# Patient Record
Sex: Female | Born: 1960 | Race: White | Hispanic: No | Marital: Married | Smoking: Never smoker
Health system: Southern US, Community
[De-identification: ages and names within clinical notes are randomized; demographics above are authoritative.]

## PROBLEM LIST (undated history)

## (undated) DIAGNOSIS — Q513 Bicornate uterus: Secondary | ICD-10-CM

## (undated) DIAGNOSIS — D271 Benign neoplasm of left ovary: Secondary | ICD-10-CM

## (undated) DIAGNOSIS — N809 Endometriosis, unspecified: Secondary | ICD-10-CM

## (undated) DIAGNOSIS — R61 Generalized hyperhidrosis: Secondary | ICD-10-CM

## (undated) DIAGNOSIS — H548 Legal blindness, as defined in USA: Secondary | ICD-10-CM

## (undated) HISTORY — PX: DERMOID CYST  EXCISION: SHX1452

## (undated) HISTORY — DX: Legal blindness, as defined in USA: H54.8

## (undated) HISTORY — DX: Endometriosis, unspecified: N80.9

## (undated) HISTORY — DX: Generalized hyperhidrosis: R61

## (undated) HISTORY — DX: Benign neoplasm of left ovary: D27.1

## (undated) HISTORY — DX: Bicornate uterus: Q51.3

## (undated) HISTORY — PX: MOLE REMOVAL: SHX2046

---

## 1999-02-06 ENCOUNTER — Encounter: Payer: Self-pay | Admitting: Obstetrics and Gynecology

## 1999-02-06 ENCOUNTER — Encounter: Admission: RE | Admit: 1999-02-06 | Discharge: 1999-02-06 | Payer: Self-pay | Admitting: Obstetrics and Gynecology

## 2000-08-11 ENCOUNTER — Encounter: Admission: RE | Admit: 2000-08-11 | Discharge: 2000-08-11 | Payer: Self-pay | Admitting: Obstetrics and Gynecology

## 2000-08-11 ENCOUNTER — Encounter: Payer: Self-pay | Admitting: Obstetrics and Gynecology

## 2000-08-17 ENCOUNTER — Encounter: Admission: RE | Admit: 2000-08-17 | Discharge: 2000-08-17 | Payer: Self-pay | Admitting: Obstetrics and Gynecology

## 2000-08-17 ENCOUNTER — Encounter: Payer: Self-pay | Admitting: Obstetrics and Gynecology

## 2001-08-03 ENCOUNTER — Other Ambulatory Visit: Admission: RE | Admit: 2001-08-03 | Discharge: 2001-08-03 | Payer: Self-pay | Admitting: Obstetrics and Gynecology

## 2002-05-10 ENCOUNTER — Encounter: Admission: RE | Admit: 2002-05-10 | Discharge: 2002-05-10 | Payer: Self-pay | Admitting: Obstetrics and Gynecology

## 2002-05-10 ENCOUNTER — Encounter: Payer: Self-pay | Admitting: Obstetrics and Gynecology

## 2002-08-06 ENCOUNTER — Other Ambulatory Visit: Admission: RE | Admit: 2002-08-06 | Discharge: 2002-08-06 | Payer: Self-pay | Admitting: Obstetrics and Gynecology

## 2003-07-15 ENCOUNTER — Other Ambulatory Visit: Admission: RE | Admit: 2003-07-15 | Discharge: 2003-07-15 | Payer: Self-pay | Admitting: Obstetrics and Gynecology

## 2003-07-30 ENCOUNTER — Encounter: Admission: RE | Admit: 2003-07-30 | Discharge: 2003-07-30 | Payer: Self-pay | Admitting: Obstetrics and Gynecology

## 2004-08-11 ENCOUNTER — Other Ambulatory Visit: Admission: RE | Admit: 2004-08-11 | Discharge: 2004-08-11 | Payer: Self-pay | Admitting: Obstetrics and Gynecology

## 2004-12-01 ENCOUNTER — Encounter: Admission: RE | Admit: 2004-12-01 | Discharge: 2004-12-01 | Payer: Self-pay | Admitting: Obstetrics and Gynecology

## 2005-09-21 ENCOUNTER — Other Ambulatory Visit: Admission: RE | Admit: 2005-09-21 | Discharge: 2005-09-21 | Payer: Self-pay | Admitting: Obstetrics & Gynecology

## 2006-02-17 ENCOUNTER — Encounter: Admission: RE | Admit: 2006-02-17 | Discharge: 2006-02-17 | Payer: Self-pay | Admitting: Obstetrics & Gynecology

## 2006-10-06 ENCOUNTER — Other Ambulatory Visit: Admission: RE | Admit: 2006-10-06 | Discharge: 2006-10-06 | Payer: Self-pay | Admitting: Obstetrics and Gynecology

## 2007-04-11 ENCOUNTER — Encounter: Admission: RE | Admit: 2007-04-11 | Discharge: 2007-04-11 | Payer: Self-pay | Admitting: Obstetrics and Gynecology

## 2007-10-24 ENCOUNTER — Other Ambulatory Visit: Admission: RE | Admit: 2007-10-24 | Discharge: 2007-10-24 | Payer: Self-pay | Admitting: Obstetrics & Gynecology

## 2008-08-20 ENCOUNTER — Encounter: Admission: RE | Admit: 2008-08-20 | Discharge: 2008-08-20 | Payer: Self-pay | Admitting: Obstetrics and Gynecology

## 2009-10-13 ENCOUNTER — Encounter: Admission: RE | Admit: 2009-10-13 | Discharge: 2009-10-13 | Payer: Self-pay | Admitting: Obstetrics and Gynecology

## 2010-02-09 ENCOUNTER — Encounter: Payer: Self-pay | Admitting: Obstetrics and Gynecology

## 2011-01-18 ENCOUNTER — Other Ambulatory Visit: Payer: Self-pay | Admitting: Obstetrics and Gynecology

## 2011-01-18 DIAGNOSIS — Z1231 Encounter for screening mammogram for malignant neoplasm of breast: Secondary | ICD-10-CM

## 2011-02-02 ENCOUNTER — Ambulatory Visit: Payer: Self-pay

## 2011-02-24 ENCOUNTER — Ambulatory Visit
Admission: RE | Admit: 2011-02-24 | Discharge: 2011-02-24 | Disposition: A | Discharge status: Home / Self Care | Payer: BC Managed Care – PPO | Source: Ambulatory Visit | Attending: Obstetrics and Gynecology | Admitting: Obstetrics and Gynecology

## 2011-02-24 DIAGNOSIS — Z1231 Encounter for screening mammogram for malignant neoplasm of breast: Secondary | ICD-10-CM

## 2011-02-24 LAB — HM PAP SMEAR: HM Pap smear: NEGATIVE

## 2013-01-01 ENCOUNTER — Other Ambulatory Visit: Payer: Self-pay

## 2013-01-01 DIAGNOSIS — Z1231 Encounter for screening mammogram for malignant neoplasm of breast: Secondary | ICD-10-CM

## 2013-01-30 ENCOUNTER — Ambulatory Visit
Admission: RE | Admit: 2013-01-30 | Discharge: 2013-01-30 | Disposition: A | Discharge status: Home / Self Care | Payer: BC Managed Care – PPO | Source: Ambulatory Visit

## 2013-01-30 DIAGNOSIS — Z1231 Encounter for screening mammogram for malignant neoplasm of breast: Secondary | ICD-10-CM

## 2013-03-30 ENCOUNTER — Ambulatory Visit: Payer: Self-pay | Admitting: Nurse Practitioner

## 2013-04-02 ENCOUNTER — Encounter: Payer: Self-pay | Admitting: Nurse Practitioner

## 2013-04-03 ENCOUNTER — Encounter: Payer: Self-pay | Admitting: Nurse Practitioner

## 2013-04-03 ENCOUNTER — Ambulatory Visit (INDEPENDENT_AMBULATORY_CARE_PROVIDER_SITE_OTHER): Payer: BC Managed Care – PPO | Admitting: Nurse Practitioner

## 2013-04-03 VITALS — BP 120/76 | HR 64 | Resp 16 | Ht 63.0 in | Wt 134.0 lb

## 2013-04-03 DIAGNOSIS — Q513 Bicornate uterus: Secondary | ICD-10-CM | POA: Insufficient documentation

## 2013-04-03 DIAGNOSIS — Z01419 Encounter for gynecological examination (general) (routine) without abnormal findings: Secondary | ICD-10-CM

## 2013-04-03 DIAGNOSIS — Z0000 Encounter for general adult medical examination without abnormal findings: Secondary | ICD-10-CM

## 2013-04-03 DIAGNOSIS — D259 Leiomyoma of uterus, unspecified: Secondary | ICD-10-CM

## 2013-04-03 DIAGNOSIS — D219 Benign neoplasm of connective and other soft tissue, unspecified: Secondary | ICD-10-CM | POA: Insufficient documentation

## 2013-04-03 LAB — POCT URINALYSIS DIPSTICK
Bilirubin, UA: NEGATIVE
Blood, UA: NEGATIVE
GLUCOSE UA: NEGATIVE
Ketones, UA: NEGATIVE
LEUKOCYTES UA: NEGATIVE
Nitrite, UA: NEGATIVE
Protein, UA: NEGATIVE
UROBILINOGEN UA: NEGATIVE
pH, UA: 6.5

## 2013-04-03 LAB — HEMOGLOBIN, FINGERSTICK: HEMOGLOBIN, FINGERSTICK: 13.8 g/dL (ref 12.0–16.0)

## 2013-04-03 MED ORDER — LO LOESTRIN FE 1 MG-10 MCG / 10 MCG PO TABS: 1.0000 | ORAL_TABLET | Freq: Every day | ORAL | Status: DC

## 2013-04-03 NOTE — Patient Instructions (Signed)

## 2013-04-03 NOTE — Progress Notes (Signed)
Patient ID: Sierra Odonnell, female   DOB: 01-Aug-1960, 53 y.o.   MRN: [id number] 53 y.o. [id number] Married Caucasian Fe here for annual exam.  Menses of 4 days and light to spotting.  No further heavy days on the brand name Lo Loestrin.  Last year on brand Lo Loestrin she was having menses at 7 days.  After consultation with Dr. Sabra Odonnell we had decided to do endo biopsy.   Pt was aware of recommendation but did not return for follow-up as her bleeding normalized on the OCPs.   Patient's last menstrual period was 03/22/2013.          Sexually active: yes  The current method of family planning is OCP (estrogen/progesterone).    Exercising: yes  cardio and weights Smoker:  no  Health Maintenance: Pap:  02/24/11, WNL, neg HR HPV MMG:  01/30/13, Bi-Rads 1: negative Colonoscopy:  Never will decide later this year. BMD:  2013 w/PCP, normal TDaP:  2003 Labs:  HB:  13.8 Urine:  negative   reports that she has never smoked. She has never used smokeless tobacco. She reports that she drinks alcohol. She reports that she does not use illicit drugs.  Past Medical History  Diagnosis Date  . Bicornate uterus   . Endometriosis   . Legally blind in right eye, as defined in [place] age 43    20/400, secondary toxoplasmosis  . Hyperhidrosis   . Dermoid cyst of left ovary age 11    Past Surgical History  Procedure Laterality Date  . Cesarean section      x 1  . Dermoid cyst  excision Right age 50    ovarian cyctectomy    Current Outpatient Prescriptions  Medication Sig Dispense Refill  . Calcium Carbonate-Vit D-Min (CALCIUM 1200 PO) Take 1,200 mg by mouth daily.      . Flaxseed, Linseed, (FLAX SEED OIL) 1000 MG CAPS Take by mouth.      . LO LOESTRIN FE 1 MG-10 MCG / 10 MCG tablet Take 1 tablet by mouth daily.  3 Package  3  . MAGNESIUM PO Take by mouth.      . Multiple Vitamin (MULTIVITAMIN) tablet Take 1 tablet by mouth daily.      [name] Kitchen b complex vitamins tablet Take 1 tablet by mouth daily.       No  current facility-administered medications for this visit.    Family History  Problem Relation Age of Onset  . Hyperlipidemia Mother   . Thyroid disease Mother   . Hypertension Father     ROS:  Pertinent items are noted in HPI.  Otherwise, a comprehensive ROS was negative.  Exam:   BP 120/76  Pulse 64  Resp 16  Ht 5\' 3"  (1.6 m)  Wt 134 lb (60.782 kg)  BMI 23.74 kg/m2  LMP 03/22/2013 Height: 5\' 3"  (160 cm)  Ht Readings from Last 3 Encounters:  04/03/13 5\' 3"  (1.6 m)    General appearance: alert, cooperative and appears stated age Head: Normocephalic, without obvious abnormality, atraumatic Neck: no adenopathy, supple, symmetrical, trachea midline and thyroid normal to inspection and palpation Lungs: clear to auscultation bilaterally Breasts: normal appearance, no masses or tenderness Heart: regular rate and rhythm Abdomen: soft, non-tender; no masses,  no organomegaly Extremities: extremities normal, atraumatic, no cyanosis or edema Skin: Skin color, texture, turgor normal. No rashes or lesions Lymph nodes: Cervical, supraclavicular, and axillary nodes normal. No abnormal inguinal nodes palpated Neurologic: Grossly normal   Pelvic: External genitalia:  no  lesions              Urethra:  normal appearing urethra with no masses, tenderness or lesions              Bartholin's and Skene's: normal                 Vagina: normal appearing vagina with normal color and discharge, no lesions              Cervix: anteverted              Pap taken: no Bimanual Exam:  Uterus:  normal size, contour, position, consistency, mobility, non-tender              Adnexa: no mass, fullness, tenderness               Rectovaginal: Confirms               Anus:  normal sphincter tone, no lesions  A:  Well Woman with normal exam  Perimenopausal on OCP  History of menorrhagia - now better  P:   Pap smear as per guidelines not done   Mammogram due 01/2014  Refilled OCP - Lo Loestrin brand name  and savings card given  She is aware that I will consult with Dr. Sabra Odonnell and may decide to DC OCP and see how she does. - she is concerned about hormonal imbalance and menorrhagia again.  Counseled on breast self exam, mammography screening, adequate intake of calcium and vitamin D, diet and exercise return annually or prn  An After Visit Summary was printed and given to the patient.

## 2013-04-09 NOTE — Progress Notes (Signed)
Reviewed personally.  M. Suzanne Lenzen, MD.  

## 2013-05-10 ENCOUNTER — Telehealth: Payer: Self-pay | Admitting: Nurse Practitioner

## 2013-05-10 DIAGNOSIS — N938 Other specified abnormal uterine and vaginal bleeding: Secondary | ICD-10-CM

## 2013-05-10 NOTE — Telephone Encounter (Signed)
Left msg for pt. To CB ----Tried to call patient and discuss with her Dr. Ammie Ferrier recommendation for PUS to check endo lining and fibroids.

## 2013-05-15 NOTE — Telephone Encounter (Signed)
I was able to contact patient and let her know about the recommendation of PUS by Dr. Sabra Heck.  She is agreeable but has concerns about insurance coverage.  She will need to make final decision once she finds that out.  I will place order and go from there.

## 2013-05-16 ENCOUNTER — Telehealth: Payer: Self-pay | Admitting: Obstetrics and Gynecology

## 2013-05-16 NOTE — Telephone Encounter (Signed)
Left message for patient to call back. Need to go over benefits and schedule PUS °

## 2013-05-21 NOTE — Telephone Encounter (Signed)
patient wants to hold off on this procedure for financial reasons.

## 2013-11-19 ENCOUNTER — Encounter: Payer: Self-pay | Admitting: Nurse Practitioner

## 2014-01-31 ENCOUNTER — Telehealth: Payer: Self-pay | Admitting: Nurse Practitioner

## 2014-01-31 NOTE — Telephone Encounter (Signed)
Left message to reschedule aex appointment. °

## 2014-02-13 ENCOUNTER — Telehealth: Payer: Self-pay | Admitting: Nurse Practitioner

## 2014-02-13 NOTE — Telephone Encounter (Signed)
Spoke with patient. Patient states that she wants to discuss coming off of her Lo Loestrin at this time. Patient would like to be seen with Milford Cage, FNP before stopping OCP to go over everything and discuss it with her. Last aex was 04/03/13. Moved appointment up to 3/22 at 10:15am with Milford Cage, FNP. Patient is agreeable to date and time.  Routing to provider for final review. Patient agreeable to disposition. Will close encounter

## 2014-02-13 NOTE — Telephone Encounter (Signed)
Patient is askiing if she needs to continue with her lo lo estrin until her next appointment 04/20/14. Patient thinks she may want to stop taking this prescription and would like to know what to expect. Last seen 04/03/13 with Edman Circle.

## 2014-02-25 ENCOUNTER — Other Ambulatory Visit: Payer: Self-pay | Admitting: Nurse Practitioner

## 2014-02-26 NOTE — Telephone Encounter (Signed)
Medication refill request: Lo Loestrin FE 1/10 Last AEX:  04/03/13 with Ms. Patty Next AEX: 04/09/14 with Ms. Patty Last MMG (if hormonal medication request): 01/31/13 Breast Density category D: Bi-Rads 1: Negative Refill authorized: #28/1 rfs, please advise.

## 2014-03-19 ENCOUNTER — Other Ambulatory Visit: Payer: Self-pay

## 2014-03-19 DIAGNOSIS — Z1231 Encounter for screening mammogram for malignant neoplasm of breast: Secondary | ICD-10-CM

## 2014-03-23 ENCOUNTER — Other Ambulatory Visit: Payer: Self-pay | Admitting: Nurse Practitioner

## 2014-03-25 ENCOUNTER — Ambulatory Visit
Admission: RE | Admit: 2014-03-25 | Discharge: 2014-03-25 | Disposition: A | Discharge status: Home / Self Care | Payer: BLUE CROSS/BLUE SHIELD | Source: Ambulatory Visit

## 2014-03-25 ENCOUNTER — Encounter (INDEPENDENT_AMBULATORY_CARE_PROVIDER_SITE_OTHER): Payer: Self-pay

## 2014-03-25 DIAGNOSIS — Z1231 Encounter for screening mammogram for malignant neoplasm of breast: Secondary | ICD-10-CM

## 2014-04-05 ENCOUNTER — Ambulatory Visit: Payer: Self-pay | Admitting: Nurse Practitioner

## 2014-04-09 ENCOUNTER — Ambulatory Visit (INDEPENDENT_AMBULATORY_CARE_PROVIDER_SITE_OTHER): Payer: BLUE CROSS/BLUE SHIELD | Admitting: Nurse Practitioner

## 2014-04-09 ENCOUNTER — Encounter: Payer: Self-pay | Admitting: Nurse Practitioner

## 2014-04-09 VITALS — BP 136/82 | HR 68 | Ht 63.0 in | Wt 136.0 lb

## 2014-04-09 DIAGNOSIS — Z0000 Encounter for general adult medical examination without abnormal findings: Secondary | ICD-10-CM

## 2014-04-09 DIAGNOSIS — Z01419 Encounter for gynecological examination (general) (routine) without abnormal findings: Secondary | ICD-10-CM

## 2014-04-09 LAB — HEMOGLOBIN, FINGERSTICK: HEMOGLOBIN, FINGERSTICK: 13.4 g/dL (ref 12.0–16.0)

## 2014-04-09 NOTE — Patient Instructions (Addendum)

## 2014-04-09 NOTE — Progress Notes (Signed)
Patient ID: Sierra Odonnell, female   DOB: 1961/01/07, 54 y.o.   MRN: [id number] 54 y.o. [id number] Married  Caucasian Fe here for annual exam.   For past 6-7 months menstrual irregularity.  She will gets a cycle for 2 weeks.  Bleeding is on and off during that time.  Some moderate days that is 1-3 days, changing tampon every 4-7 hours.  No vaso symptoms.  She has a history of bicornate uterus with last PUS 07/2011 showing UTE fibroids.  She is on Lo Loestrin and is afraid to come off OCP as there may be more AUB.   Patient's last menstrual period was 03/24/2014.          Sexually active: Yes.    The current method of family planning is OCP (estrogen/progesterone).    Exercising:  "somewhat", weights and stretching Smoker:  no  Health Maintenance: Pap:  02/24/11, negative with neg HR HPV MMG:  03/25/14, Bi-Rads 1:  Negative Colonoscopy:  Will schedule with PCP BMD:   2014, PCP, normal per patient TD: 2003 ? Repeat in 2013 Labs: HB:  13.4  Urine:  Negative    reports that she has never smoked. She has never used smokeless tobacco. She reports that she drinks alcohol. She reports that she does not use illicit drugs.  Past Medical History  Diagnosis Date  . Bicornate uterus   . Endometriosis   . Legally blind in right eye, as defined in [place] age 38    20/400, secondary toxoplasmosis  . Hyperhidrosis   . Dermoid cyst of left ovary age 73    Past Surgical History  Procedure Laterality Date  . Cesarean section      x 1  . Dermoid cyst  excision Right age 67    ovarian cyctectomy    Current Outpatient Prescriptions  Medication Sig Dispense Refill  . b complex vitamins tablet Take 1 tablet by mouth daily.    . Calcium Carbonate-Vit D-Min (CALCIUM 1200 PO) Take 1,200 mg by mouth daily.    . Flaxseed, Linseed, (FLAX SEED OIL) 1000 MG CAPS Take by mouth.    . LO LOESTRIN FE 1 MG-10 MCG / 10 MCG tablet TAKE 1 TABLET BY MOUTH DAILY. 28 tablet 1  . MAGNESIUM PO Take by mouth.    . Multiple  Vitamin (MULTIVITAMIN) tablet Take 1 tablet by mouth daily.    . Ranitidine HCl (ACID REDUCER PO) Take 1 tablet by mouth daily.     No current facility-administered medications for this visit.    Family History  Problem Relation Age of Onset  . Hyperlipidemia Mother   . Thyroid disease Mother   . Hypertension Father     ROS:  Pertinent items are noted in HPI.  Otherwise, a comprehensive ROS was negative.  Exam:   BP 136/82 mmHg  Pulse 68  Ht 5\' 3"  (1.6 m)  Wt 136 lb (61.689 kg)  BMI 24.10 kg/m2  LMP 03/24/2014 Height: 5\' 3"  (160 cm) Ht Readings from Last 3 Encounters:  04/09/14 5\' 3"  (1.6 m)  04/03/13 5\' 3"  (1.6 m)    General appearance: alert, cooperative and appears stated age Head: Normocephalic, without obvious abnormality, atraumatic Neck: no adenopathy, supple, symmetrical, trachea midline and thyroid normal to inspection and palpation Lungs: clear to auscultation bilaterally Breasts: normal appearance, no masses or tenderness Heart: regular rate and rhythm Abdomen: soft, non-tender; no masses,  no organomegaly Extremities: extremities normal, atraumatic, no cyanosis or edema Skin: Skin color, texture, turgor normal. No  rashes or lesions Lymph nodes: Cervical, supraclavicular, and axillary nodes normal. No abnormal inguinal nodes palpated Neurologic: Grossly normal   Pelvic: External genitalia:  no lesions              Urethra:  normal appearing urethra with no masses, tenderness or lesions              Bartholin's and Skene's: normal                 Vagina: normal appearing vagina with normal color and discharge, no lesions              Cervix: anteverted              Pap taken: Yes.   Bimanual Exam:  Uterus:  enlarged, 6-8  weeks size              Adnexa: no mass, fullness, tenderness               Rectovaginal: Confirms               Anus:  normal sphincter tone, no lesions  Chaperone present:  yes  A:  Well Woman with normal exam  Perimenopausal on  OCP History of menorrhagia - now AUB on OCP   P:   Reviewed health and wellness pertinent to exam  Pap smear not taken today  Mammogram is due 3/17  She is on week # 2 of current OCP pack - did not refill OCP at this time  Will need to review with Dr. Sabra Heck if another PUS should be done with AUB or come off OCP now.  Counseled on breast self exam, mammography screening, use and side effects of OCP's, adequate intake of calcium and vitamin D, diet and exercise return annually or prn  An After Visit Summary was printed and given to the patient.

## 2014-04-12 LAB — IPS PAP TEST WITH HPV

## 2014-04-17 NOTE — Progress Notes (Signed)
Needs PUS and endometrial biopsy.  Will decide after exam/results regarding OCPs.  Reviewed personally.  Felipa Emory, MD.

## 2014-04-19 ENCOUNTER — Other Ambulatory Visit: Payer: Self-pay | Admitting: Nurse Practitioner

## 2014-04-19 DIAGNOSIS — N939 Abnormal uterine and vaginal bleeding, unspecified: Secondary | ICD-10-CM

## 2014-04-22 ENCOUNTER — Telehealth: Payer: Self-pay | Admitting: Nurse Practitioner

## 2014-04-22 ENCOUNTER — Ambulatory Visit: Payer: Self-pay | Admitting: Nurse Practitioner

## 2014-04-22 DIAGNOSIS — N939 Abnormal uterine and vaginal bleeding, unspecified: Secondary | ICD-10-CM

## 2014-04-22 NOTE — Telephone Encounter (Signed)
Left message for patient to call back. Need to go over benefits and schedule PUS °

## 2014-04-23 NOTE — Telephone Encounter (Signed)
-----   Message from Antonietta Barcelona, [place] sent at 04/19/2014  7:16 PM EDT ----- See note about PUS and endo biopsy.  Will you let pt. Know that this is the plan per Dr. Sabra Heck - I will place orders. ----- Message -----    From: Megan Salon, MD    Sent: 04/17/2014   2:12 PM      To: Antonietta Barcelona, FNP    ----- Message -----    From: Antonietta Barcelona, FNP    Sent: 04/09/2014   5:53 PM      To: Megan Salon, MD

## 2014-04-24 NOTE — Telephone Encounter (Addendum)
Spoke with patient to advise of recommendations from Dr. Sabra Heck and Milford Cage, [place].  Evaluation of AUB with  endometrial biopsy and Pelvic ultrasound.   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   Advised patient would let provider know regarding her concerns and she will be contacted back to discuss insurance coverage. She is agreeable.

## 2014-04-24 NOTE — Telephone Encounter (Signed)
Call to patient. Advised of benefit quote received for PUS/EMB. Patient declines due to costs. Patient would like to know if there is any alternative treatment available.

## 2014-05-03 NOTE — Telephone Encounter (Signed)
Routing to Dr Overbey for review.  

## 2014-05-05 NOTE — Telephone Encounter (Signed)
I am aware pt concerned due to cost and possibility that no "conclusive result" will be found.  Lots of conclusive results can be found with ultrasound +/- endometrial biopsy like adenomyosis, fibroids, polyps, malignancies.  Do not feel pt should continue on OCPs without some evaluation as bleeding is irregular on OCPs.    Could start with PUS and only if there are significant findings, would biopsy be done.  However, cannot guarantee that it won't be done.  Not sure she is going to proceed with anything at this time.

## 2014-05-08 NOTE — Telephone Encounter (Signed)
Message left to return call to Mckaylah Bettendorf at [phone].    

## 2014-05-14 NOTE — Telephone Encounter (Signed)
Spoke with patient. She is given message from Dr. Sabra Heck and she is agreeable to scheduling ultrasound at this time.  She is scheduled for 05/21/14 with Dr. Sabra Heck.   Routing to provider for final review. Patient agreeable to disposition. Will close encounter

## 2014-05-14 NOTE — Telephone Encounter (Signed)
Message left to return call to Tery Hoeger at [phone].    

## 2014-05-16 ENCOUNTER — Telehealth: Payer: Self-pay | Admitting: Obstetrics & Gynecology

## 2014-05-16 NOTE — Telephone Encounter (Signed)
Left message for patient to call back. Need to go over out of pocket amount for PUS

## 2014-05-18 ENCOUNTER — Other Ambulatory Visit: Payer: Self-pay | Admitting: Nurse Practitioner

## 2014-05-20 NOTE — Telephone Encounter (Signed)
Medication refill request: Lo Loestrin  Last AEX:  04/09/14 PG Next AEX: 04/15/15 PG Last MMG (if hormonal medication request): 03/25/14 [id number]:Neg Refill authorized: 02/26/14 #28 tabs/ 1 Refills. Today please advise.

## 2014-05-21 ENCOUNTER — Other Ambulatory Visit: Payer: Self-pay | Admitting: Obstetrics & Gynecology

## 2014-05-21 ENCOUNTER — Ambulatory Visit: Payer: BLUE CROSS/BLUE SHIELD

## 2014-05-21 ENCOUNTER — Ambulatory Visit (INDEPENDENT_AMBULATORY_CARE_PROVIDER_SITE_OTHER): Payer: BLUE CROSS/BLUE SHIELD | Admitting: Obstetrics & Gynecology

## 2014-05-21 VITALS — BP 128/82 | Wt 133.4 lb

## 2014-05-21 DIAGNOSIS — N939 Abnormal uterine and vaginal bleeding, unspecified: Secondary | ICD-10-CM | POA: Diagnosis not present

## 2014-05-21 DIAGNOSIS — Q513 Bicornate uterus: Secondary | ICD-10-CM

## 2014-05-21 DIAGNOSIS — D251 Intramural leiomyoma of uterus: Secondary | ICD-10-CM | POA: Diagnosis not present

## 2014-05-21 MED ORDER — NORETHINDRONE 0.35 MG PO TABS
1.0000 | ORAL_TABLET | Freq: Every day | ORAL | Status: DC
Start: 2014-05-21 — End: 2015-04-10

## 2014-05-21 NOTE — Telephone Encounter (Signed)
See note from Dr. Sabra Heck as she feels she should not continue on OCP secondary to AUB.

## 2014-05-21 NOTE — Progress Notes (Signed)
54 y.o. G34P2A1 Married W female here for a pelvic ultrasound due to known hx of fibroids and increased DUB.  Pt has hx of bicornuate uterus as well.  Pt is on Loloestrin for contraception.  OVer last six months, bleeding has become more irregular.  For example, last month she spotted for almost three weeks.  None of the bleeding was heavy or with clots.  She did have some mild cramping at time.  She does take her pills regularly but reports there is just regularity to the bleeding.  She admits having a high deductible health insurance plan currently and is very anxious about any additional health care costs.  Wants to do what is best but also very cautious.  Sexually active:  yes  Contraception: oral contraceptives (estrogen/progesterone)  FINDINGS: UTERUS: 10 x 9 x 5 cm, bicornuate.  Four fibroids present.  Three noted with last PUS done 08/11/11 in our office.  Largest is 5.5cm (was 3.7), then 4.6 (was 3.8cm), and 1.6cm 9was 1.3cm) and another 1.7cm fibroid.   EMS: 2.1 and 2.33mm on each side ADNEXA:   Left ovary 3.1 x 1.2 x 0.8cm   Right ovary 3.0 x 1.6 x 0.7cm CUL DE SAC: no free fluid  Reviewed images with patient and compared prior ultrasound images for pt to see changes.  Due to thine endometrium, feel biopsy will not give much useful information.  Would like to know pt's hormonal status, considering age.  Also, d/w pt transitioning from estrogen containing OCPs to progestin only OCP for risk reduction (DVT, PE, stroke, MI).  Pt actually very comfortable with this idea.  Will then plan [facility] after pt has been off estrogen containing OCPs for three to four weeks.  Hopefully, this wil help with cycles right now.  Pt is not interested in any surgery at this time and is hopeful she will get to menopause where the fibroids will shrink and symptoms will resolve.   Assessment:  Uterine fibroids, bicornuate uterus, DUB  Plan:  Will change to micronor from [employer]  Pt can transition now and not finish  current OCP pack.  Aware needs to be off Loloestrin for three weeks or so before having [place].  Order placed.  Pt will call back to scheduled.  Plan recheck three to four months after switching to micronor.    ~20 minutes spent with patient >50% of time was in face to face discussion of above.

## 2014-05-22 ENCOUNTER — Encounter: Payer: Self-pay | Admitting: Obstetrics & Gynecology

## 2014-05-22 DIAGNOSIS — N939 Abnormal uterine and vaginal bleeding, unspecified: Secondary | ICD-10-CM | POA: Insufficient documentation

## 2014-05-22 DIAGNOSIS — Q513 Bicornate uterus: Secondary | ICD-10-CM | POA: Insufficient documentation

## 2014-05-22 DIAGNOSIS — D251 Intramural leiomyoma of uterus: Secondary | ICD-10-CM | POA: Insufficient documentation

## 2014-06-18 ENCOUNTER — Other Ambulatory Visit (INDEPENDENT_AMBULATORY_CARE_PROVIDER_SITE_OTHER): Payer: BLUE CROSS/BLUE SHIELD

## 2014-06-18 DIAGNOSIS — N939 Abnormal uterine and vaginal bleeding, unspecified: Secondary | ICD-10-CM

## 2014-06-19 LAB — FOLLICLE STIMULATING HORMONE: FSH: 99.4 m[IU]/mL

## 2014-06-24 ENCOUNTER — Telehealth: Payer: Self-pay | Admitting: Nurse Practitioner

## 2014-06-24 NOTE — Telephone Encounter (Signed)
Patient states she is lookingfor result no my chart log in. Patient ok for call back

## 2014-06-24 NOTE — Telephone Encounter (Addendum)
Left message to call Warrick at [phone].    Notes Recorded by Megan Salon, MD on 06/21/2014 at 5:27 PM Please inform [place] is 99. She is clearly in menopause. Stop all OCPs and call with any future bleeding. If she has any bleeding immediately after stopping pills, this doesn't count. Since stopping all hormones, I would like to repeat PUS in 4-6 months to make sure fibroids are not growing. They had increased in size with last ultrasound in comparison to prior ultrasound. Thanks.

## 2014-06-25 NOTE — Telephone Encounter (Signed)
Spoke with patient. Advised of message as seen below from Sierra Odonnell. Patient is agreeable and verbalizes understanding. Patient declines to schedule PUS at this time. Would like to check her calendar and return call to schedule. Patient states she has been experiencing vaginal dryness lately. Advised this can come from being in menopausal range. Advised may use Olive Oil or Coconut Oil as needed for increased comfort and moisture. Advised may also discuss prescription vaginal cream with Dr.Earnest if she does not have relief. Patient is agreeable.  Routing to provider for final review. Patient agreeable to disposition. Will close encounter.

## 2015-02-19 DIAGNOSIS — L409 Psoriasis, unspecified: Secondary | ICD-10-CM | POA: Insufficient documentation

## 2015-04-10 ENCOUNTER — Ambulatory Visit (INDEPENDENT_AMBULATORY_CARE_PROVIDER_SITE_OTHER): Payer: BLUE CROSS/BLUE SHIELD | Admitting: Obstetrics and Gynecology

## 2015-04-10 ENCOUNTER — Encounter: Payer: Self-pay | Admitting: Obstetrics and Gynecology

## 2015-04-10 VITALS — BP 110/70 | HR 80 | Resp 14 | Ht 62.5 in | Wt 131.0 lb

## 2015-04-10 DIAGNOSIS — N939 Abnormal uterine and vaginal bleeding, unspecified: Secondary | ICD-10-CM | POA: Diagnosis not present

## 2015-04-10 DIAGNOSIS — Z124 Encounter for screening for malignant neoplasm of cervix: Secondary | ICD-10-CM

## 2015-04-10 DIAGNOSIS — Z01419 Encounter for gynecological examination (general) (routine) without abnormal findings: Secondary | ICD-10-CM | POA: Diagnosis not present

## 2015-04-10 NOTE — Patient Instructions (Signed)

## 2015-04-10 NOTE — Progress Notes (Signed)
Patient ID: Sierra Odonnell, female   DOB: 1960/02/19, 55 y.o.   MRN: [id number] 55 y.o. [id number] MarriedCaucasianF here for annual exam.  The patient has a h/o a bicornuate uterus, fibroids and AUB. Last year she was taken off of Loloestrin and was put on micronor. She went off the micronor in 6/16 after having an elevated FSH. Since going off POP she was having some occasional spotting x 4-5 months. No bleeding from 11/16 until last week. That was light spotting for 3 days. Slight discomfort prior to the spotting occuring. Sexually active, no pain. Some vaginal dryness, currently better. Occasional vasomotor symptoms.  Period Duration (Days): 3 days spotting  Period Pattern: (!) Irregular Menstrual Flow: Light Menstrual Control: Panty liner Dysmenorrhea: None  Patient's last menstrual period was 03/31/2015.          Sexually active: Yes.    The current method of family planning is none.    Exercising: No.  The patient does not participate in regular exercise at present. Smoker:  no  Health Maintenance: Pap:  3--22-16 WNL NEG HR HPV History of abnormal Pap:  no MMG:  03-26-14 WNL  Colonoscopy:  11-29-14 polyps repeat in 5 yrs BMD:   2014 WNL per patient TDaP:  09/2014 Gardasil: N/A   reports that she has never smoked. She has never used smokeless tobacco. She reports that she drinks alcohol. She reports that she does not use illicit drugs. She works as a Geophysicist/field seismologist. Daughters are 96 and 45. She has a 72 month old grandson, living with her right now.   Past Medical History  Diagnosis Date  . Bicornate uterus   . Endometriosis   . Legally blind in right eye, as defined in [place] age 85    20/400, secondary toxoplasmosis  . Hyperhidrosis   . Dermoid cyst of left ovary age 51    Past Surgical History  Procedure Laterality Date  . Cesarean section      x 1  . Dermoid cyst  excision Right age 3    ovarian cyctectomy  1 Vaginal delivery at 61 weeks, 1 C/S at 15 weeks for  breech  Current Outpatient Prescriptions  Medication Sig Dispense Refill  . b complex vitamins tablet Take 1 tablet by mouth daily.    . Calcium Carbonate-Vit D-Min (CALCIUM 1200 PO) Take 1,200 mg by mouth daily.    . Flaxseed, Linseed, (FLAX SEED OIL) 1000 MG CAPS Take by mouth.    [name] Kitchen MAGNESIUM PO Take by mouth.    . Multiple Vitamin (MULTIVITAMIN) tablet Take 1 tablet by mouth daily.    . Ranitidine HCl (ACID REDUCER PO) Take 1 tablet by mouth daily.     No current facility-administered medications for this visit.    Family History  Problem Relation Age of Onset  . Hyperlipidemia Mother   . Thyroid disease Mother   . Hypertension Father     Review of Systems  Constitutional: Negative.   HENT: Negative.   Eyes: Negative.   Respiratory: Negative.   Cardiovascular: Negative.   Gastrointestinal: Negative.   Endocrine: Negative.   Genitourinary: Positive for menstrual problem.       Irregular vaginal bleeding   Musculoskeletal: Negative.   Skin: Negative.   Allergic/Immunologic: Negative.   Neurological: Negative.   Psychiatric/Behavioral: Negative.     Exam:   BP 110/70 mmHg  Pulse 80  Resp 14  Ht 5' 2.5" (1.588 m)  Wt 131 lb (59.421 kg)  BMI 23.56 kg/m2  LMP  03/31/2015  Weight change: @WEIGHTCHANGE @ Height:   Height: 5' 2.5" (158.8 cm)  Ht Readings from Last 3 Encounters:  04/10/15 5' 2.5" (1.588 m)  04/09/14 5\' 3"  (1.6 m)  04/03/13 5\' 3"  (1.6 m)    General appearance: alert, cooperative and appears stated age Head: Normocephalic, without obvious abnormality, atraumatic Neck: no adenopathy, supple, symmetrical, trachea midline and thyroid normal to inspection and palpation Lungs: clear to auscultation bilaterally Breasts: normal appearance, no masses or tenderness Heart: regular rate and rhythm Abdomen: soft, non-tender; bowel sounds normal; no masses,  no organomegaly Extremities: extremities normal, atraumatic, no cyanosis or edema Skin: Skin color,  texture, turgor normal. No rashes or lesions Lymph nodes: Cervical, supraclavicular, and axillary nodes normal. No abnormal inguinal nodes palpated Neurologic: Grossly normal   Pelvic: External genitalia:  no lesions              Urethra:  normal appearing urethra with no masses, tenderness or lesions              Bartholins and Skenes: normal                 Vagina: normal appearing vagina with mild atrophy, normal color and discharge, no lesions. Small amount of blood in the vagina.              Cervix: no lesions               Bimanual Exam:  Uterus:  normal size, contour, position, consistency, mobility, non-tender              Adnexa: no mass, fullness, tenderness               Rectovaginal: Confirms               Anus:  normal sphincter tone, no lesions  Chaperone was present for exam.  A:  Well Woman with normal exam  Abnormal uterine bleeding, suspect PMP  P:   Labs and immunizations with her primary MD  Pap with hpv  FSH, AMH  Return for an ultrasound and possible endometrial biopsy, possible sonohysterogram  Due for a mammogram  Colonoscopy UTD  Discussed breast self exam  Discussed calcium and vit D intake

## 2015-04-11 ENCOUNTER — Ambulatory Visit: Payer: BLUE CROSS/BLUE SHIELD | Admitting: Nurse Practitioner

## 2015-04-11 ENCOUNTER — Telehealth: Payer: Self-pay | Admitting: Obstetrics and Gynecology

## 2015-04-11 DIAGNOSIS — K219 Gastro-esophageal reflux disease without esophagitis: Secondary | ICD-10-CM | POA: Insufficient documentation

## 2015-04-11 LAB — IPS PAP TEST WITH REFLEX TO HPV

## 2015-04-11 LAB — FOLLICLE STIMULATING HORMONE: FSH: 108.5 m[IU]/mL

## 2015-04-11 NOTE — Telephone Encounter (Signed)
Spoke with patient. Advised of message as seen below from [place]. She is agreeable and verbalizes understanding. Aware AMH and pap smear are still pending and she will be notified when these results return.  Cc: Dr.Jertson  Routing to covering provider for final review. Patient agreeable to disposition. Will close encounter.

## 2015-04-11 NOTE — Telephone Encounter (Signed)
Spoke with Sierra Odonnell regarding benefit for ultrasound. Patient understood and agreeable. Patient ready to schedule. Patient scheduled 04/22/15 with Dr Talbert Nan. Sierra Odonnell aware of arrival date and time. Sierra Odonnell aware of 72 hours cancellation policy with [id number] fee. No further questions. Ok to close

## 2015-04-11 NOTE — Telephone Encounter (Signed)
Left message to call Kaitlyn at [phone]. 

## 2015-04-11 NOTE — Telephone Encounter (Signed)
When speaking with patient regarding benefits, she asked if lab results were available for recent tests performed in our office. Routing to Clinical Triage for review

## 2015-04-11 NOTE — Telephone Encounter (Signed)
FSH is 108.5, consistent with menopause.   Cc- Dr. Talbert Nan.

## 2015-04-11 NOTE — Telephone Encounter (Signed)
Patient was seen on 04/10/2015 with Dr.Jertson. Had AMH, [place], and pap smear performed. Routing to Dr.Silva for review of [place] results. Pap and AMH are still pending.

## 2015-04-14 LAB — ANTI MULLERIAN HORMONE: AMH AssessR: <0.03 ng/mL

## 2015-04-15 ENCOUNTER — Ambulatory Visit: Payer: BLUE CROSS/BLUE SHIELD | Admitting: Nurse Practitioner

## 2015-04-22 ENCOUNTER — Ambulatory Visit (INDEPENDENT_AMBULATORY_CARE_PROVIDER_SITE_OTHER): Payer: BLUE CROSS/BLUE SHIELD

## 2015-04-22 ENCOUNTER — Ambulatory Visit (INDEPENDENT_AMBULATORY_CARE_PROVIDER_SITE_OTHER): Payer: BLUE CROSS/BLUE SHIELD | Admitting: Obstetrics and Gynecology

## 2015-04-22 ENCOUNTER — Encounter: Payer: Self-pay | Admitting: Obstetrics and Gynecology

## 2015-04-22 VITALS — BP 118/80 | HR 80 | Resp 16 | Wt 132.0 lb

## 2015-04-22 DIAGNOSIS — N939 Abnormal uterine and vaginal bleeding, unspecified: Secondary | ICD-10-CM | POA: Diagnosis not present

## 2015-04-22 DIAGNOSIS — D259 Leiomyoma of uterus, unspecified: Secondary | ICD-10-CM | POA: Diagnosis not present

## 2015-04-22 DIAGNOSIS — Q513 Bicornate uterus: Secondary | ICD-10-CM

## 2015-04-22 DIAGNOSIS — N950 Postmenopausal bleeding: Secondary | ICD-10-CM

## 2015-04-22 NOTE — Progress Notes (Signed)
Patient ID: Sierra Odonnell, female   DOB: [birth year], 55 y.o.   MRN: [id number] GYNECOLOGY  VISIT   HPI: 55 y.o.   Married  Caucasian  female   [id number] with Patient's last menstrual period was 03/31/2015.   here for ultra sound- transvaginal/pelvic. The patient was taken off of micronor last June after having an elevated FSH. She has had a few episodes of spotting, The last 2 episodes were in November, 2016 and March in 2017. No bleeding since then. She had a recent PMP [place] and AMH.   GYNECOLOGIC HISTORY: Patient's last menstrual period was 03/31/2015. Contraception:none Menopausal hormone therapy: none         OB History    Gravida Para Term Preterm AB TAB SAB Ectopic Multiple Living   3 2 0 2 1 1    2          Patient Active Problem List   Diagnosis Date Noted  . Abnormal uterine bleeding (AUB) 05/22/2014  . Bicornuate uterus 05/22/2014  . Intramural leiomyoma of uterus 05/22/2014  . Bicornate uterus 04/03/2013  . Fibroids 04/03/2013    Past Medical History  Diagnosis Date  . Bicornate uterus   . Endometriosis   . 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 Hyperhidrosis   . Dermoid cyst of left ovary age 55    Past Surgical History  Procedure Laterality Date  . Cesarean section      x 1  . Dermoid cyst  excision Right age 55    ovarian cyctectomy    Current Outpatient Prescriptions  Medication Sig Dispense Refill  . b complex vitamins tablet Take 1 tablet by mouth daily.    . Calcium Carbonate-Vit D-Min (CALCIUM 1200 PO) Take 1,200 mg by mouth daily.    . Flaxseed, Linseed, (FLAX SEED OIL) 1000 MG CAPS Take by mouth.    [name] Kitchen MAGNESIUM PO Take by mouth.    . Multiple Vitamin (MULTIVITAMIN) tablet Take 1 tablet by mouth daily.    . Ranitidine HCl (ACID REDUCER PO) Take 1 tablet by mouth daily.     No current facility-administered medications for this visit.     ALLERGIES: Review of patient's allergies indicates no  known allergies.  Family History  Problem Relation Age of Onset  . Hyperlipidemia Mother   . Thyroid disease Mother   . Hypertension Father     Social History   Social History  . Marital Status: Married    Spouse Name: N/A  . Number of Children: 2  . Years of Education: N/A   Occupational History  . Not on file.   Social History Main Topics  . Smoking status: Never Smoker   . Smokeless tobacco: Never Used  . Alcohol Use: 0.0 oz/week    0 Standard drinks or equivalent per week  . Drug Use: No  . Sexual Activity:    Partners: Male    Birth Control/ Protection: None, Pill   Other Topics Concern  . Not on file   Social History Narrative    Review of Systems  Constitutional: Negative.   HENT: Negative.   Eyes: Negative.   Respiratory: Negative.   Cardiovascular: Negative.   Gastrointestinal: Negative.   Genitourinary: Negative.   Musculoskeletal: Negative.   Skin: Negative.   Neurological: Negative.   Endo/Heme/Allergies: Negative.   Psychiatric/Behavioral: Negative.     PHYSICAL EXAMINATION:    BP 118/80 mmHg  Pulse 80  Resp 16  Wt 132 lb (59.875 kg)  LMP 03/31/2015    General appearance: alert, cooperative and appears stated age  Ultrasound pictures were reviewed with the patient. Her fibroids are much smaller. She has a bicornuate uterus with a uniformly thin endometrial stripe. Please see report and U/S pictures   ASSESSMENT PMP spotting, thin endometrium Bicornuate uterus Fibroids, shrinking    PLAN Will not further w/u at this point If she were to bleed again, would recommend hysteroscopy, D&C. I'm concerned with just an endometrial biopsy I could miss one horn of her uterus and potential pathology.  The patient understands and will call with further bleeding.   An After Visit Summary was printed and given to the patient.  13 minutes face to face time of which over 50% was spent in counseling.

## 2015-05-23 DIAGNOSIS — F419 Anxiety disorder, unspecified: Secondary | ICD-10-CM | POA: Insufficient documentation

## 2015-06-04 ENCOUNTER — Other Ambulatory Visit: Payer: Self-pay

## 2015-06-04 DIAGNOSIS — Z1231 Encounter for screening mammogram for malignant neoplasm of breast: Secondary | ICD-10-CM

## 2015-06-24 ENCOUNTER — Ambulatory Visit
Admission: RE | Admit: 2015-06-24 | Discharge: 2015-06-24 | Disposition: A | Discharge status: Home / Self Care | Payer: BLUE CROSS/BLUE SHIELD | Source: Ambulatory Visit

## 2015-06-24 DIAGNOSIS — Z1231 Encounter for screening mammogram for malignant neoplasm of breast: Secondary | ICD-10-CM

## 2016-04-15 ENCOUNTER — Encounter: Payer: Self-pay | Admitting: Obstetrics and Gynecology

## 2016-04-15 ENCOUNTER — Ambulatory Visit (INDEPENDENT_AMBULATORY_CARE_PROVIDER_SITE_OTHER): Payer: BLUE CROSS/BLUE SHIELD | Admitting: Obstetrics and Gynecology

## 2016-04-15 VITALS — BP 112/70 | HR 64 | Resp 14 | Ht 64.0 in | Wt 127.0 lb

## 2016-04-15 DIAGNOSIS — Z01419 Encounter for gynecological examination (general) (routine) without abnormal findings: Secondary | ICD-10-CM | POA: Diagnosis not present

## 2016-04-15 NOTE — Patient Instructions (Signed)

## 2016-04-15 NOTE — Progress Notes (Addendum)
56 y.o. [id number] MarriedCaucasianF here for annual exam.   The patient has a h/o a bicornuate uterus, fibroids and AUB. Now postmenopausal, no bleeding in a year.  Sexually active, no pain.     Patient's last menstrual period was 03/31/2015.          Sexually active: Yes.    The current method of family planning is post menopausal status.    Exercising: Yes.    recently joined gym  Smoker:  no  Health Maintenance: Pap:  04-10-15 WNL 04-09-14 WNL NEG HR HPV  History of abnormal Pap:  no MMG:  06-24-15 WNL  Colonoscopy:  11/2014 polyps -repeat in 5 years  BMD:   2015 WNL per patient  TDaP:  11/2014  Gardasil: N/A   reports that she has never smoked. She has never used smokeless tobacco. She reports that she drinks alcohol. She reports that she does not use drugs.Daughters are 45 and 21, she has a 1 year old grandson.  She works as a Geophysicist/field seismologist.  Past Medical History:  Diagnosis Date  . Bicornate uterus   . Dermoid cyst of left ovary age 40  . Endometriosis   . Hyperhidrosis   . 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 CESAREAN SECTION     x 1  . DERMOID CYST  EXCISION Right age 88   ovarian cyctectomy    Current Outpatient Prescriptions  Medication Sig Dispense Refill  . azithromycin (ZITHROMAX) 250 MG tablet Take 2 tablets on day 1, then 1 tablet daily on days 2-5    . b complex vitamins tablet Take 1 tablet by mouth daily.    . Calcium Carbonate-Vit D-Min (CALCIUM 1200 PO) Take 1,200 mg by mouth daily.    . Flaxseed, Linseed, (FLAX SEED OIL) 1000 MG CAPS Take by mouth.    [name] Kitchen MAGNESIUM PO Take by mouth.    . Multiple Vitamin (MULTIVITAMIN) tablet Take 1 tablet by mouth daily.    . Ranitidine HCl (ACID REDUCER PO) Take 1 tablet by mouth daily.     No current facility-administered medications for this visit.     Family History  Problem Relation Age of Onset  .  Hyperlipidemia Mother   . Thyroid disease Mother   . Hypertension Father     Review of Systems  Constitutional: Negative.   HENT: Negative.   Eyes: Negative.   Respiratory: Negative.   Cardiovascular: Negative.   Gastrointestinal: Negative.   Endocrine: Negative.   Genitourinary: Negative.   Musculoskeletal: Negative.   Skin: Negative.   Allergic/Immunologic: Negative.   Neurological: Negative.   Psychiatric/Behavioral: Negative.     Exam:   BP 112/70 (BP Location: Right Arm, Patient Position: Sitting, Cuff Size: Normal)   Pulse 64   Resp 14   Ht 5\' 4"  (1.626 m)   Wt 127 lb (57.6 kg)   LMP 03/31/2015   BMI 21.80 kg/m   Weight change: @WEIGHTCHANGE @ Height:   Height: 5\' 4"  (162.6 cm)  Ht Readings from Last 3 Encounters:  04/15/16 5\' 4"  (1.626 m)  04/10/15 5' 2.5" (1.588 m)  04/09/14 5\' 3"  (1.6 m)    General appearance: alert, cooperative and appears stated age Head: Normocephalic, without obvious abnormality, atraumatic Neck: no adenopathy, supple, symmetrical, trachea midline and thyroid normal to inspection and palpation Lungs: clear to auscultation bilaterally Cardiovascular: regular rate and rhythm Breasts: normal appearance, no  masses or tenderness Abdomen: soft, non-tender; bowel sounds normal; no masses,  no organomegaly Extremities: extremities normal, atraumatic, no cyanosis or edema Skin: Skin color, texture, turgor normal. No rashes or lesions Lymph nodes: Cervical, supraclavicular, and axillary nodes normal. No abnormal inguinal nodes palpated Neurologic: Grossly normal   Pelvic: External genitalia:  no lesions              Urethra:  normal appearing urethra with no masses, tenderness or lesions              Bartholins and Skenes: normal                 Vagina: normal appearing atrophic vagina with normal color and discharge, no lesions              Cervix: no lesions               Bimanual Exam:  Uterus:  normal size, contour, position, consistency,  mobility, non-tender and anteverted              Adnexa: no mass, fullness, tenderness               Rectovaginal: Confirms               Anus:  normal sphincter tone, no lesions  Chaperone was present for exam.  A:  Well Woman with normal exam  P:   No pap this year  Mammogram in 6/18  Colonoscopy UTD  Labs and immunizations with primary  Discussed breast self exam  Discussed calcium and vit D intake   Addendum: error above, she has an enlarged right posterior cervical lymph node, she noticed it with recent sinus infection. She will f/u with her primary MD.

## 2016-08-20 ENCOUNTER — Other Ambulatory Visit: Payer: Self-pay | Admitting: Nurse Practitioner

## 2016-08-20 ENCOUNTER — Other Ambulatory Visit: Payer: Self-pay | Admitting: Obstetrics and Gynecology

## 2016-08-20 DIAGNOSIS — Z1231 Encounter for screening mammogram for malignant neoplasm of breast: Secondary | ICD-10-CM

## 2016-08-27 ENCOUNTER — Ambulatory Visit
Admission: RE | Admit: 2016-08-27 | Discharge: 2016-08-27 | Disposition: A | Discharge status: Home / Self Care | Payer: BLUE CROSS/BLUE SHIELD | Source: Ambulatory Visit | Attending: Obstetrics and Gynecology | Admitting: Obstetrics and Gynecology

## 2016-08-27 DIAGNOSIS — Z1231 Encounter for screening mammogram for malignant neoplasm of breast: Secondary | ICD-10-CM

## 2017-04-27 ENCOUNTER — Encounter: Payer: Self-pay | Admitting: Obstetrics and Gynecology

## 2017-04-27 ENCOUNTER — Other Ambulatory Visit: Payer: Self-pay

## 2017-04-27 ENCOUNTER — Ambulatory Visit (INDEPENDENT_AMBULATORY_CARE_PROVIDER_SITE_OTHER): Payer: BLUE CROSS/BLUE SHIELD | Admitting: Obstetrics and Gynecology

## 2017-04-27 VITALS — BP 110/62 | HR 80 | Resp 14 | Ht 62.5 in | Wt 135.0 lb

## 2017-04-27 DIAGNOSIS — Z01419 Encounter for gynecological examination (general) (routine) without abnormal findings: Secondary | ICD-10-CM

## 2017-04-27 NOTE — Progress Notes (Addendum)
57 y.o. [id number] MarriedCaucasianF here for annual exam. No bleeding. No dyspareunia.      Patient's last menstrual period was 03/31/2015.          Sexually active: Yes.    The current method of family planning is post menopausal status.    Exercising: Yes.    gym Smoker:  no  Health Maintenance: Pap:  04-10-15 WNL 04-09-14 WNL NEG Hr HPV  History of abnormal Pap:  no MMG:  08-30-16 WNL  Colonoscopy:  2016 polyps, ? F/u in 5 years  BMD:   2015 WNL per patient  TDaP:  12-05-14 Gardasil: N/A   reports that she has never smoked. She has never used smokeless tobacco. She reports that she drinks about 2.4 oz of alcohol per week. She reports that she does not use drugs. Daughters are 42 and 55, she has a 87 year old grandson.  She works as a Geophysicist/field seismologist.    Past Medical History:  Diagnosis Date  . Bicornate uterus   . Dermoid cyst of left ovary age 82  . Endometriosis   . Hyperhidrosis   . 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 CESAREAN SECTION     x 1  . DERMOID CYST  EXCISION Right age 73   ovarian cyctectomy    Current Outpatient Medications  Medication Sig Dispense Refill  . b complex vitamins tablet Take 1 tablet by mouth daily.    . Calcium Carbonate-Vit D-Min (CALCIUM 1200 PO) Take 1,200 mg by mouth daily.    . Flaxseed, Linseed, (FLAX SEED OIL) 1000 MG CAPS Take by mouth.    [name] Kitchen MAGNESIUM PO Take by mouth.    . Multiple Vitamin (MULTIVITAMIN) tablet Take 1 tablet by mouth daily.    . Ranitidine HCl (ACID REDUCER PO) Take 1 tablet by mouth daily.     No current facility-administered medications for this visit.     Family History  Problem Relation Age of Onset  . Hyperlipidemia Mother   . Thyroid disease Mother   . Hypertension Father     Review of Systems  Constitutional: Negative.   HENT: Negative.   Eyes: Negative.   Respiratory: Negative.    Cardiovascular: Negative.   Gastrointestinal: Negative.   Endocrine: Negative.   Genitourinary: Negative.   Musculoskeletal: Negative.   Skin: Negative.   Allergic/Immunologic: Negative.   Neurological: Negative.   Psychiatric/Behavioral: Negative.     Exam:   BP 110/62 (BP Location: Right Arm, Patient Position: Sitting, Cuff Size: Normal)   Pulse 80   Resp 14   Ht 5' 2.5" (1.588 m)   Wt 135 lb (61.2 kg)   LMP 03/31/2015   BMI 24.30 kg/m   Weight change: @WEIGHTCHANGE @ Height:   Height: 5' 2.5" (158.8 cm)  Ht Readings from Last 3 Encounters:  04/27/17 5' 2.5" (1.588 m)  04/15/16 5\' 4"  (1.626 m)  04/10/15 5' 2.5" (1.588 m)    General appearance: alert, cooperative and appears stated age Head: Normocephalic, without obvious abnormality, atraumatic Neck: no adenopathy, supple, symmetrical, trachea midline and thyroid normal to inspection and palpation Lungs: clear to auscultation bilaterally Cardiovascular: regular rate and rhythm Breasts: normal appearance, no masses or tenderness Abdomen: soft, non-tender; non distended,  no masses,  no organomegaly Extremities: extremities normal, atraumatic, no cyanosis or edema Skin: Skin color, texture, turgor normal. No rashes or lesions Lymph nodes: Cervical, supraclavicular,  and axillary nodes normal. No abnormal inguinal nodes palpated Neurologic: Grossly normal   Pelvic: External genitalia:  no lesions              Urethra:  normal appearing urethra with no masses, tenderness or lesions              Bartholins and Skenes: normal                 Vagina: normal appearing vagina with normal color and discharge, no lesions              Cervix: no lesions               Bimanual Exam:  Uterus:  normal size, contour, position, consistency, mobility, non-tender              Adnexa: no mass, fullness, tenderness               Rectovaginal: Confirms               Anus:  normal sphincter tone, no lesions  Chaperone was present for  exam.  A:  Well Woman with normal exam  P:   No pap this year  Mammogram in 8/19  Colonoscopy UTD  Labs with primary  Discussed breast self exam  Discussed calcium and vit D intake  Addendum: the patient mentioned that she is having occasional, tolerable vasomotor symptoms. She is having some issues with sleep and waking up during the night. Sometimes when she wakes up she is slightly sweaty. Discussed avoiding triggers, discussed OTC agents. Recommended she try Estroven PM, call if she feels she needs something else.

## 2017-04-27 NOTE — Patient Instructions (Addendum)

## 2017-08-23 ENCOUNTER — Other Ambulatory Visit: Payer: Self-pay | Admitting: Obstetrics and Gynecology

## 2017-08-23 DIAGNOSIS — Z1231 Encounter for screening mammogram for malignant neoplasm of breast: Secondary | ICD-10-CM

## 2017-09-29 ENCOUNTER — Ambulatory Visit
Admission: RE | Admit: 2017-09-29 | Discharge: 2017-09-29 | Disposition: A | Discharge status: Home / Self Care | Payer: BLUE CROSS/BLUE SHIELD | Source: Ambulatory Visit | Attending: Obstetrics and Gynecology | Admitting: Obstetrics and Gynecology

## 2017-09-29 DIAGNOSIS — Z1231 Encounter for screening mammogram for malignant neoplasm of breast: Secondary | ICD-10-CM

## 2017-11-01 DIAGNOSIS — H16223 Keratoconjunctivitis sicca, not specified as Sjogren's, bilateral: Secondary | ICD-10-CM | POA: Insufficient documentation

## 2017-11-01 DIAGNOSIS — H43393 Other vitreous opacities, bilateral: Secondary | ICD-10-CM | POA: Insufficient documentation

## 2017-11-01 DIAGNOSIS — H5212 Myopia, left eye: Secondary | ICD-10-CM | POA: Insufficient documentation

## 2017-11-01 DIAGNOSIS — H2513 Age-related nuclear cataract, bilateral: Secondary | ICD-10-CM | POA: Insufficient documentation

## 2018-05-31 ENCOUNTER — Ambulatory Visit: Payer: BLUE CROSS/BLUE SHIELD | Admitting: Obstetrics and Gynecology

## 2018-08-15 ENCOUNTER — Other Ambulatory Visit: Payer: Self-pay

## 2018-08-17 ENCOUNTER — Ambulatory Visit (INDEPENDENT_AMBULATORY_CARE_PROVIDER_SITE_OTHER): Payer: BC Managed Care – PPO | Admitting: Obstetrics and Gynecology

## 2018-08-17 ENCOUNTER — Encounter: Payer: Self-pay | Admitting: Obstetrics and Gynecology

## 2018-08-17 ENCOUNTER — Other Ambulatory Visit: Payer: Self-pay

## 2018-08-17 VITALS — BP 108/70 | HR 80 | Temp 98.4°F | Ht 62.6 in | Wt 129.8 lb

## 2018-08-17 DIAGNOSIS — Z01419 Encounter for gynecological examination (general) (routine) without abnormal findings: Secondary | ICD-10-CM | POA: Diagnosis not present

## 2018-08-17 NOTE — Patient Instructions (Signed)

## 2018-08-17 NOTE — Progress Notes (Signed)
58 y.o. G3P0212 Married White or Caucasian [name] here for annual exam.   A couple of months ago she had a burning sensation on her left breast, lasted for a week then resolved. No lumps. No vaginal bleeding. No dyspareunia.   She is having some hot flashes, occasional night sweats, tolerable.     Patient's last menstrual period was 03/31/2015.          Sexually active: Yes.    The current method of family planning is post menopausal status.    Exercising: Yes.    burn boot camp before [place], walking Smoker:  no  Health Maintenance: Pap:  04-10-15 WNL 04-09-14 WNL NEG Hr HPV  History of abnormal Pap:  no MMG: 09/29/2017 Birads 1 negative Colonoscopy:  2016 polyps, ? F/u in 5 years  BMD:   2015 WNL per patient   TDaP:  12-05-14 Gardasil: N/A   reports that she has never smoked. She has never used smokeless tobacco. She reports current alcohol use of about 4.0 standard drinks of alcohol per week. She reports that she does not use drugs. 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   Past Medical History:  Diagnosis Date  . Bicornate uterus   . Dermoid cyst of left ovary age 60  . Endometriosis   . Hyperhidrosis   . 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 CESAREAN SECTION     x 1  . DERMOID CYST  EXCISION Right age 6   ovarian cyctectomy    Current Outpatient Medications  Medication Sig Dispense Refill  . b complex vitamins tablet Take 1 tablet by mouth daily.    . Calcium Carbonate-Vit D-Min (CALCIUM 1200 PO) Take 1,200 mg by mouth daily.    . Flaxseed, Linseed, (FLAX SEED OIL) 1000 MG CAPS Take by mouth.    [name] Kitchen MAGNESIUM PO Take by mouth.    . Multiple Vitamin (MULTIVITAMIN) tablet Take 1 tablet by mouth daily.    . Ranitidine HCl (ACID REDUCER PO) Take 1 tablet by mouth daily.     No current facility-administered medications for this  visit.     Family History  Problem Relation Age of Onset  . Hyperlipidemia Mother   . Thyroid disease Mother   . Hypertension Father     Review of Systems  Constitutional: Negative.   HENT: Negative.   Eyes: Negative.   Respiratory: Negative.   Cardiovascular: Negative.   Gastrointestinal: Negative.   Endocrine: Negative.   Genitourinary: Negative.   Musculoskeletal: Negative.   Skin: Negative.   Allergic/Immunologic: Negative.   Neurological: Negative.   Hematological: Negative.   Psychiatric/Behavioral: Negative.     Exam:   BP 108/70 (BP Location: Right Arm, Patient Position: Sitting, Cuff Size: Normal)   Pulse 80   Temp 98.4 F (36.9 C) (Skin)   Ht 5' 2.6" (1.59 m)   Wt 129 lb 12.8 oz (58.9 kg)   LMP 03/31/2015   BMI 23.29 kg/m   Weight change: @WEIGHTCHANGE @ Height:   Height: 5' 2.6" (159 cm)  Ht Readings from Last 3 Encounters:  08/17/18 5' 2.6" (1.59 m)  04/27/17 5' 2.5" (1.588 m)  04/15/16 5\' 4"  (1.626 m)    General appearance: alert, cooperative and appears stated age Head: Normocephalic, without obvious abnormality, atraumatic Neck: no adenopathy, supple, symmetrical, trachea midline and thyroid normal to inspection  and palpation Lungs: clear to auscultation bilaterally Cardiovascular: regular rate and rhythm Breasts: normal appearance, no masses or tenderness Abdomen: soft, non-tender; non distended,  no masses,  no organomegaly Extremities: extremities normal, atraumatic, no cyanosis or edema Skin: Skin color, texture, turgor normal. No rashes or lesions Lymph nodes: Cervical, supraclavicular, and axillary nodes normal. No abnormal inguinal nodes palpated Neurologic: Grossly normal   Pelvic: External genitalia:  no lesions              Urethra:  normal appearing urethra with no masses, tenderness or lesions              Bartholins and Skenes: normal                 Vagina: atrophic appearing vagina with normal color and discharge, no lesions               Cervix: no lesions               Bimanual Exam:  Uterus:  normal size, contour, position, consistency, mobility, non-tender              Adnexa: no mass, fullness, tenderness               Rectovaginal: Confirms               Anus:  normal sphincter tone, no lesions  Chaperone was present for exam.  A:  Well Woman with normal exam  P:   Pap next year  Colonoscopy in 2021?  Discussed breast self exam  Discussed calcium and vit D intake  Labs with primary  Discussed breast self exam  Discussed calcium and vit D intake

## 2018-10-24 ENCOUNTER — Other Ambulatory Visit: Payer: Self-pay | Admitting: Obstetrics and Gynecology

## 2018-10-24 DIAGNOSIS — Z1231 Encounter for screening mammogram for malignant neoplasm of breast: Secondary | ICD-10-CM

## 2018-11-03 ENCOUNTER — Other Ambulatory Visit: Payer: Self-pay

## 2018-11-03 ENCOUNTER — Ambulatory Visit
Admission: RE | Admit: 2018-11-03 | Discharge: 2018-11-03 | Disposition: A | Discharge status: Home / Self Care | Payer: BC Managed Care – PPO | Source: Ambulatory Visit | Attending: Obstetrics and Gynecology | Admitting: Obstetrics and Gynecology

## 2018-11-03 DIAGNOSIS — Z1231 Encounter for screening mammogram for malignant neoplasm of breast: Secondary | ICD-10-CM

## 2019-01-18 ENCOUNTER — Encounter

## 2019-10-08 ENCOUNTER — Other Ambulatory Visit: Payer: Self-pay | Admitting: Obstetrics and Gynecology

## 2019-10-08 DIAGNOSIS — Z1231 Encounter for screening mammogram for malignant neoplasm of breast: Secondary | ICD-10-CM

## 2019-11-06 ENCOUNTER — Ambulatory Visit
Admission: RE | Admit: 2019-11-06 | Discharge: 2019-11-06 | Disposition: A | Discharge status: Home / Self Care | Payer: BC Managed Care – PPO | Source: Ambulatory Visit | Attending: Obstetrics and Gynecology | Admitting: Obstetrics and Gynecology

## 2019-11-06 ENCOUNTER — Other Ambulatory Visit: Payer: Self-pay

## 2019-11-06 DIAGNOSIS — Z1231 Encounter for screening mammogram for malignant neoplasm of breast: Secondary | ICD-10-CM

## 2019-11-09 DIAGNOSIS — R030 Elevated blood-pressure reading, without diagnosis of hypertension: Secondary | ICD-10-CM | POA: Insufficient documentation

## 2020-05-26 NOTE — Progress Notes (Signed)
60 y.o. [id number] Married White or Caucasian [name] here for annual exam. No vaginal bleeding. Sexually active, no pain.  No bowel or bladder changes.     Patient's last menstrual period was 03/31/2015.          Sexually active: Yes. The current method of family planning is post menopausal status. Exercising: No. Smoker:  No.  Health Maintenance: Pap: 04-10-15 WNL 04-09-14 WNL NEG Hr HPV History of abnormal Pap: No. MMG: 11/06/19 Density D, BiRads 1: neg BMD: 2015 WNL per patient  Colonoscopy: 05/23/2020 polyps, repeat in 5 yrs. Results under Care Everywhere. TDaP: 12/05/14 Gardasil: n/a   reports that she has never smoked. She has never used smokeless tobacco. She reports current alcohol use of about 4.0 standard drinks of alcohol per week. She reports that she does not use drugs. 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  Her oldest daughter (65) had a tumor in spinal cord, had it removed. She had it removed and will have a long recovery, getting better.  Husband had a MI in 7/21, okay.   Past Medical History:  Diagnosis Date  . Bicornate uterus   . Dermoid cyst of left ovary age 89  . Endometriosis   . Hyperhidrosis   . 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 CESAREAN SECTION     x 1  . DERMOID CYST  EXCISION Right age 65   ovarian cyctectomy    Current Outpatient Medications  Medication Sig Dispense Refill  . b complex vitamins tablet Take 1 tablet by mouth daily.    . Calcium Carbonate-Vit D-Min (CALCIUM 1200 PO) Take 1,200 mg by mouth daily.    . cholecalciferol (VITAMIN D3) 25 MCG (1000 UNIT) tablet Take 1,000 Units by mouth daily.    . Flaxseed, Linseed, (FLAX SEED OIL) 1000 MG CAPS Take by mouth.    [name] Kitchen MAGNESIUM PO Take by mouth.    . Multiple Vitamin (MULTIVITAMIN) tablet Take 1 tablet by mouth daily.    . Ranitidine  HCl (ACID REDUCER PO) Take 1 tablet by mouth daily.     No current facility-administered medications for this visit.    Family History  Problem Relation Age of Onset  . Hyperlipidemia Mother   . Thyroid disease Mother   . Hypertension Father     Review of Systems  Constitutional: Negative.   HENT: Negative.   Eyes: Negative.   Respiratory: Negative.   Cardiovascular: Negative.   Gastrointestinal: Negative.   Endocrine: Negative.   Genitourinary: Negative.   Musculoskeletal: Negative.   Skin: Negative.   Allergic/Immunologic: Negative.   Neurological: Negative.   Hematological: Negative.   Psychiatric/Behavioral: Negative.     Exam:   BP 118/70 (BP Location: Right Arm, Patient Position: Sitting, Cuff Size: Normal)   Pulse 75   Ht 5\' 3"  (1.6 m)   Wt 123 lb (55.8 kg)   LMP 03/31/2015   SpO2 99%   BMI 21.79 kg/m   Weight change: @WEIGHTCHANGE @ Height:   Height: 5\' 3"  (160 cm)  Ht Readings from Last 3 Encounters:  06/05/20 5\' 3"  (1.6 m)  08/17/18 5' 2.6" (1.59 m)  04/27/17 5' 2.5" (1.588 m)    General appearance: alert, cooperative and appears stated age Head: Normocephalic, without obvious abnormality, atraumatic Neck: no adenopathy, supple, symmetrical, trachea midline and thyroid normal to inspection  and palpation Lungs: clear to auscultation bilaterally Cardiovascular: regular rate and rhythm Breasts: normal appearance, no masses or tenderness Abdomen: soft, non-tender; non distended,  no masses,  no organomegaly Extremities: extremities normal, atraumatic, no cyanosis or edema Skin: Skin color, texture, turgor normal. No rashes or lesions Lymph nodes: Cervical, supraclavicular, and axillary nodes normal. No abnormal inguinal nodes palpated Neurologic: Grossly normal   Pelvic: External genitalia:  no lesions              Urethra:  normal appearing urethra with no masses, tenderness or lesions              Bartholins and Skenes: normal                  Vagina: atrophic appearing vagina with normal color and discharge, no lesions              Cervix: no lesions               Bimanual Exam:  Uterus:  normal size, contour, position, consistency, mobility, non-tender              Adnexa: no mass, fullness, tenderness               Rectovaginal: Confirms               Anus:  normal sphincter tone, no lesions  Thurnell Garbe chaperoned for the exam.  1. Well woman exam Discussed breast self exam Discussed calcium and vit D intake Colonoscopy just done  Mammogram in the fall Labs with primary  2. Screening for cervical cancer - Cytology - PAP with hpv

## 2020-06-05 ENCOUNTER — Other Ambulatory Visit: Payer: Self-pay

## 2020-06-05 ENCOUNTER — Ambulatory Visit (INDEPENDENT_AMBULATORY_CARE_PROVIDER_SITE_OTHER): Payer: BC Managed Care – PPO | Admitting: Obstetrics and Gynecology

## 2020-06-05 ENCOUNTER — Encounter: Payer: Self-pay | Admitting: Obstetrics and Gynecology

## 2020-06-05 ENCOUNTER — Other Ambulatory Visit (HOSPITAL_COMMUNITY)
Admission: RE | Admit: 2020-06-05 | Discharge: 2020-06-05 | Disposition: A | Discharge status: Home / Self Care | Payer: BC Managed Care – PPO | Source: Ambulatory Visit | Attending: Obstetrics and Gynecology | Admitting: Obstetrics and Gynecology

## 2020-06-05 VITALS — BP 118/70 | HR 75 | Ht 63.0 in | Wt 123.0 lb

## 2020-06-05 DIAGNOSIS — Z01419 Encounter for gynecological examination (general) (routine) without abnormal findings: Secondary | ICD-10-CM

## 2020-06-05 DIAGNOSIS — Z124 Encounter for screening for malignant neoplasm of cervix: Secondary | ICD-10-CM | POA: Insufficient documentation

## 2020-06-05 NOTE — Patient Instructions (Signed)

## 2020-06-06 LAB — CYTOLOGY - PAP
Comment: NEGATIVE
Diagnosis: NEGATIVE
High risk HPV: NEGATIVE

## 2020-10-01 ENCOUNTER — Other Ambulatory Visit: Payer: Self-pay | Admitting: Obstetrics and Gynecology

## 2020-10-01 DIAGNOSIS — Z1231 Encounter for screening mammogram for malignant neoplasm of breast: Secondary | ICD-10-CM

## 2020-11-07 ENCOUNTER — Ambulatory Visit
Admission: RE | Admit: 2020-11-07 | Discharge: 2020-11-07 | Disposition: A | Discharge status: Home / Self Care | Payer: BC Managed Care – PPO | Source: Ambulatory Visit | Attending: Obstetrics and Gynecology | Admitting: Obstetrics and Gynecology

## 2020-11-07 ENCOUNTER — Other Ambulatory Visit: Payer: Self-pay

## 2020-11-07 DIAGNOSIS — Z1231 Encounter for screening mammogram for malignant neoplasm of breast: Secondary | ICD-10-CM

## 2021-06-01 NOTE — Progress Notes (Signed)
61 y.o. G3P0212 Married White or Caucasian [name] here for annual exam. She is having hot flashes and irritability. She is also gaining weight.   Vasomotor symptoms seem worse, but still tolerable. She has gained ~5-6 lbs.  No vaginal bleeding. No dyspareunia. No bowel or bladder c/o.    She had a mole removed from her back, ? Early melanoma, deep biopsy was fine.   Patient's last menstrual period was 03/31/2015.          Sexually active: Yes.    The current method of family planning is post menopausal status.    Exercising: Yes.     Walking  Smoker:  no  Health Maintenance: Pap: 06/05/20 WNL HR HPV Neg, 04-10-15 WNL  History of abnormal Pap:  no MMG:  11/11/20 Bi-rads 1 neg  BMD:   n/a Colonoscopy: 05/23/2020 polyps, repeat in 5 yrs. Results under Care Everywhere TDaP:  12/05/14  Gardasil: n/a   reports that she has never smoked. She has never used smokeless tobacco. She reports current alcohol use of about 4.0 standard drinks per week. She reports that she does not use drugs. She works as a Geophysicist/field seismologist, 2 daughters, 4 year old grandson.  Husband had a MI in 7/21, okay.  Older daughter had a spinal cord tumor removed, she has residual numbness below her neck, motor function is okay, sensory function is limited. Able to walk and drive, can't work.   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     Calcium Carbonate-Vit D-Min (CALCIUM 1200 PO) Take 1,200 mg by mouth daily.      cholecalciferol (VITAMIN D3) 25 MCG (1000 UNIT) tablet Take 1,000 Units by mouth daily.     Flaxseed, Linseed, (FLAX SEED OIL) 1000 MG CAPS Take by mouth.     MAGNESIUM PO Take by mouth.     Multiple Vitamin (MULTIVITAMIN) tablet Take 1 tablet by mouth daily.     Ranitidine HCl (ACID REDUCER PO) Take 1 tablet by mouth daily.     No current facility-administered medications for this visit.    Family History  Problem Relation Age of Onset   Hyperlipidemia Mother    Thyroid disease Mother    Hypertension Father     Review of Systems  All other systems reviewed and are negative.  Exam:   BP 122/74   Pulse 72   Ht 5' 2.6" (1.59 m)   Wt 128 lb (58.1 kg)   LMP 03/31/2015   SpO2 99%   BMI 22.97 kg/m   Weight change: '@WEIGHTCHANGE'$ @ Height:   Height: 5' 2.6" (159 cm)  Ht Readings from Last 3 Encounters:  06/10/21 5' 2.6" (1.59 m)  06/05/20 '5\' 3"'$  (1.6 m)  08/17/18 5' 2.6" (1.59 m)    General appearance: alert, cooperative and appears stated age Head: Normocephalic, without obvious abnormality, atraumatic Neck: no adenopathy, supple,  symmetrical, trachea midline and thyroid normal to inspection and palpation Lungs: clear to auscultation bilaterally Cardiovascular: regular rate and rhythm Breasts: normal appearance, no masses or tenderness Abdomen: soft, non-tender; non distended,  no masses,  no organomegaly Extremities: extremities normal, atraumatic, no cyanosis or edema Skin: Skin color, texture, turgor normal. No rashes or lesions Lymph nodes: Cervical, supraclavicular, and axillary nodes normal. No abnormal inguinal nodes palpated Neurologic: Grossly normal   Pelvic: External genitalia:  no lesions              Urethra:  normal appearing urethra with no masses, tenderness or lesions              Bartholins and Skenes: normal                 Vagina: normal appearing vagina with normal color and discharge, no lesions              Cervix: no lesions               Bimanual  Exam:  Uterus:  normal size, contour, position, consistency, mobility, non-tender              Adnexa: no mass, fullness, tenderness               Rectovaginal: Confirms               Anus:  normal sphincter tone, no lesions  Gae Dry chaperoned for the exam.  1. Well woman exam Discussed breast self exam Discussed calcium and vit D intake Mammogram and colonoscopy are UTD No pap this year Labs with primary

## 2021-06-10 ENCOUNTER — Ambulatory Visit (INDEPENDENT_AMBULATORY_CARE_PROVIDER_SITE_OTHER): Payer: BC Managed Care – PPO | Admitting: Obstetrics and Gynecology

## 2021-06-10 ENCOUNTER — Encounter: Payer: Self-pay | Admitting: Obstetrics and Gynecology

## 2021-06-10 VITALS — BP 122/74 | HR 72 | Ht 62.6 in | Wt 128.0 lb

## 2021-06-10 DIAGNOSIS — Z01419 Encounter for gynecological examination (general) (routine) without abnormal findings: Secondary | ICD-10-CM | POA: Diagnosis not present

## 2021-06-10 NOTE — Patient Instructions (Addendum)
For your daughter: Counselor: Rogene Houston [phone]    EXERCISE   We recommended that you start or continue a regular exercise program for good health. Physical activity is anything that gets your body moving, some is better than none. The CDC recommends 150 minutes per week of Moderate-Intensity Aerobic Activity and 2 or more days of Muscle Strengthening Activity.  Benefits of exercise are limitless: helps weight loss/weight maintenance, improves mood and energy, helps with depression and anxiety, improves sleep, tones and strengthens muscles, improves balance, improves bone density, protects from chronic conditions such as heart disease, high blood pressure and diabetes and so much more. To learn more visit: WhyNotPoker.uy  DIET: Good nutrition starts with a healthy diet of fruits, vegetables, whole grains, and lean protein sources. Drink plenty of water for hydration. Minimize empty calories, sodium, sweets. For more information about dietary recommendations visit: GeekRegister.com.ee and [email or web address]-[email or web address]  ALCOHOL:  Women should limit their alcohol intake to no more than 7 drinks/beers/glasses of wine (combined, not each!) per week. Moderation of alcohol intake to this level decreases your risk of breast cancer and liver damage.  If you are concerned that you may have a problem, or your friends have told you they are concerned about your drinking, there are many resources to help. A well-known program that is free, effective, and available to all people all over the nation is Alcoholics Anonymous.  Check out this site to learn more: BlockTaxes.se   CALCIUM AND VITAMIN D:  Adequate intake of calcium and Vitamin D are recommended for bone health.  You should be getting between 1000-1200 mg of calcium and 800 units of Vitamin D daily between diet and supplements  PAP SMEARS:  Pap smears, to  check for cervical cancer or precancers,  have traditionally been done yearly, scientific advances have shown that most women can have pap smears less often.  However, every woman still should have a physical exam from her gynecologist every year. It will include a breast check, inspection of the vulva and vagina to check for abnormal growths or skin changes, a visual exam of the cervix, and then an exam to evaluate the size and shape of the uterus and ovaries. We will also provide age appropriate advice regarding health maintenance, like when you should have certain vaccines, screening for sexually transmitted diseases, bone density testing, colonoscopy, mammograms, etc.   MAMMOGRAMS:  All women over 27 years old should have a routine mammogram.   COLON CANCER SCREENING: Now recommend starting at age 25. At this time colonoscopy is not covered for routine screening until 50. There are take home tests that can be done between 45-49.   COLONOSCOPY:  Colonoscopy to screen for colon cancer is recommended for all women at age 64.  We know, you hate the idea of the prep.  We agree, BUT, having colon cancer and not knowing it is worse!!  Colon cancer so often starts as a polyp that can be seen and removed at colonscopy, which can quite literally save your life!  And if your first colonoscopy is normal and you have no family history of colon cancer, most women don't have to have it again for 10 years.  Once every ten years, you can do something that may end up saving your life, right?  We will be happy to help you get it scheduled when you are ready.  Be sure to check your insurance coverage so you understand how much it will cost.  It may be covered as  a preventative service at no cost, but you should check your particular policy.      Breast Self-Awareness Breast self-awareness means being familiar with how your breasts look and feel. It involves checking your breasts regularly and reporting any changes to  your health care provider. Practicing breast self-awareness is important. A change in your breasts can be a sign of a serious medical problem. Being familiar with how your breasts look and feel allows you to find any problems early, when treatment is more likely to be successful. All women should practice breast self-awareness, including women who have had breast implants. How to do a breast self-exam One way to learn what is normal for your breasts and whether your breasts are changing is to do a breast self-exam. To do a breast self-exam: Look for Changes  Remove all the clothing above your waist. Stand in front of a mirror in a room with good lighting. Put your hands on your hips. Push your hands firmly downward. Compare your breasts in the mirror. Look for differences between them (asymmetry), such as: Differences in shape. Differences in size. Puckers, dips, and bumps in one breast and not the other. Look at each breast for changes in your skin, such as: Redness. Scaly areas. Look for changes in your nipples, such as: Discharge. Bleeding. Dimpling. Redness. A change in position. Feel for Changes Carefully feel your breasts for lumps and changes. It is best to do this while lying on your back on the floor and again while sitting or standing in the shower or tub with soapy water on your skin. Feel each breast in the following way: Place the arm on the side of the breast you are examining above your head. Feel your breast with the other hand. Start in the nipple area and make  inch (2 cm) overlapping circles to feel your breast. Use the pads of your three middle fingers to do this. Apply light pressure, then medium pressure, then firm pressure. The light pressure will allow you to feel the tissue closest to the skin. The medium pressure will allow you to feel the tissue that is a little deeper. The firm pressure will allow you to feel the tissue close to the ribs. Continue the  overlapping circles, moving downward over the breast until you feel your ribs below your breast. Move one finger-width toward the center of the body. Continue to use the  inch (2 cm) overlapping circles to feel your breast as you move slowly up toward your collarbone. Continue the up and down exam using all three pressures until you reach your armpit.  Write Down What You Find  Write down what is normal for each breast and any changes that you find. Keep a written record with breast changes or normal findings for each breast. By writing this information down, you do not need to depend only on memory for size, tenderness, or location. Write down where you are in your menstrual cycle, if you are still menstruating. If you are having trouble noticing differences in your breasts, do not get discouraged. With time you will become more familiar with the variations in your breasts and more comfortable with the exam. How often should I examine my breasts? Examine your breasts every month. If you are breastfeeding, the best time to examine your breasts is after a feeding or after using a breast pump. If you menstruate, the best time to examine your breasts is 5-7 days after your period is over. During your  period, your breasts are lumpier, and it may be more difficult to notice changes. When should I see my health care provider? See your health care provider if you notice: A change in shape or size of your breasts or nipples. A change in the skin of your breast or nipples, such as a reddened or scaly area. Unusual discharge from your nipples. A lump or thick area that was not there before. Pain in your breasts. Anything that concerns you.

## 2022-03-08 ENCOUNTER — Other Ambulatory Visit: Payer: Self-pay | Admitting: Obstetrics and Gynecology

## 2022-03-08 DIAGNOSIS — Z1231 Encounter for screening mammogram for malignant neoplasm of breast: Secondary | ICD-10-CM

## 2022-03-11 ENCOUNTER — Ambulatory Visit
Admission: RE | Admit: 2022-03-11 | Discharge: 2022-03-11 | Disposition: A | Discharge status: Home / Self Care | Payer: BC Managed Care – PPO | Source: Ambulatory Visit | Attending: Obstetrics and Gynecology | Admitting: Obstetrics and Gynecology

## 2022-03-11 ENCOUNTER — Ambulatory Visit: Payer: BC Managed Care – PPO

## 2022-03-11 DIAGNOSIS — Z1231 Encounter for screening mammogram for malignant neoplasm of breast: Secondary | ICD-10-CM

## 2022-12-22 IMAGING — MG MM DIGITAL SCREENING BILAT W/ TOMO AND CAD
6 of 10 series · 6 of 30 positions shown · non-contrast
Comparison: Previous exam(s).

CLINICAL DATA: Screening.

EXAM:
DIGITAL SCREENING BILATERAL MAMMOGRAM WITH TOMOSYNTHESIS AND CAD
TECHNIQUE: Bilateral screening digital craniocaudal and mediolateral oblique
mammograms were obtained. Bilateral screening digital breast
tomosynthesis was performed. The images were evaluated with
computer-aided detection.

[R MLO synth-2D]
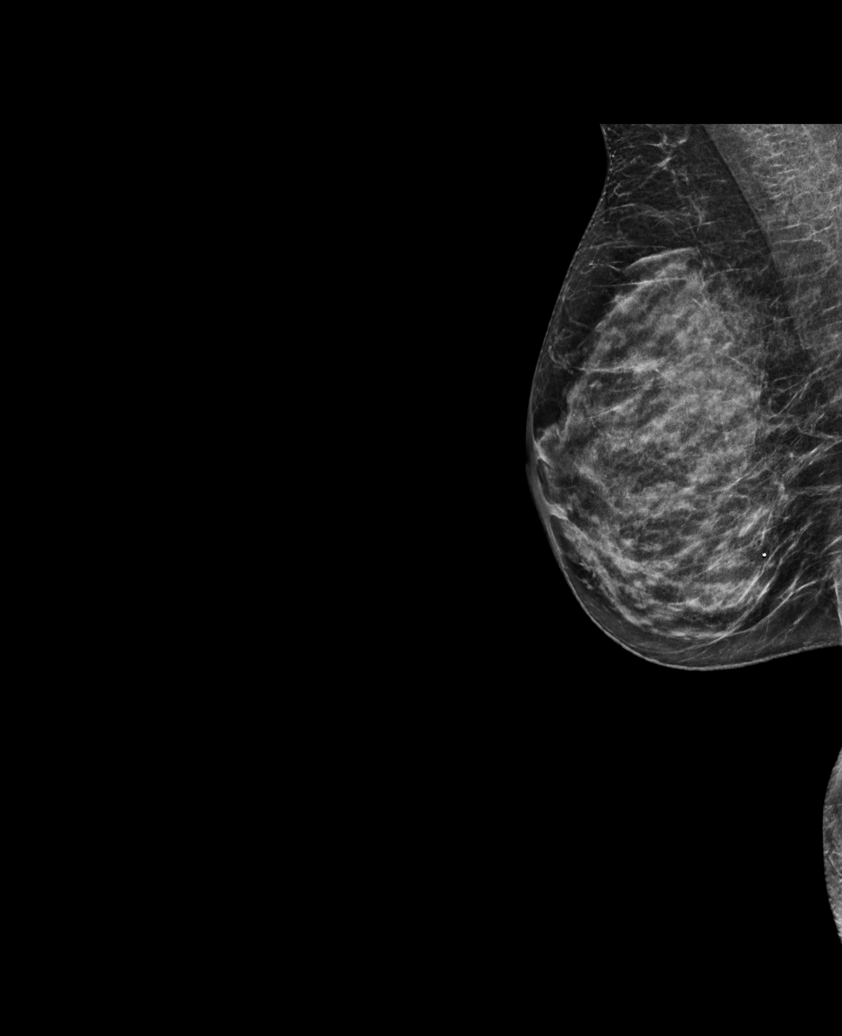

[L MLO synth-2D]
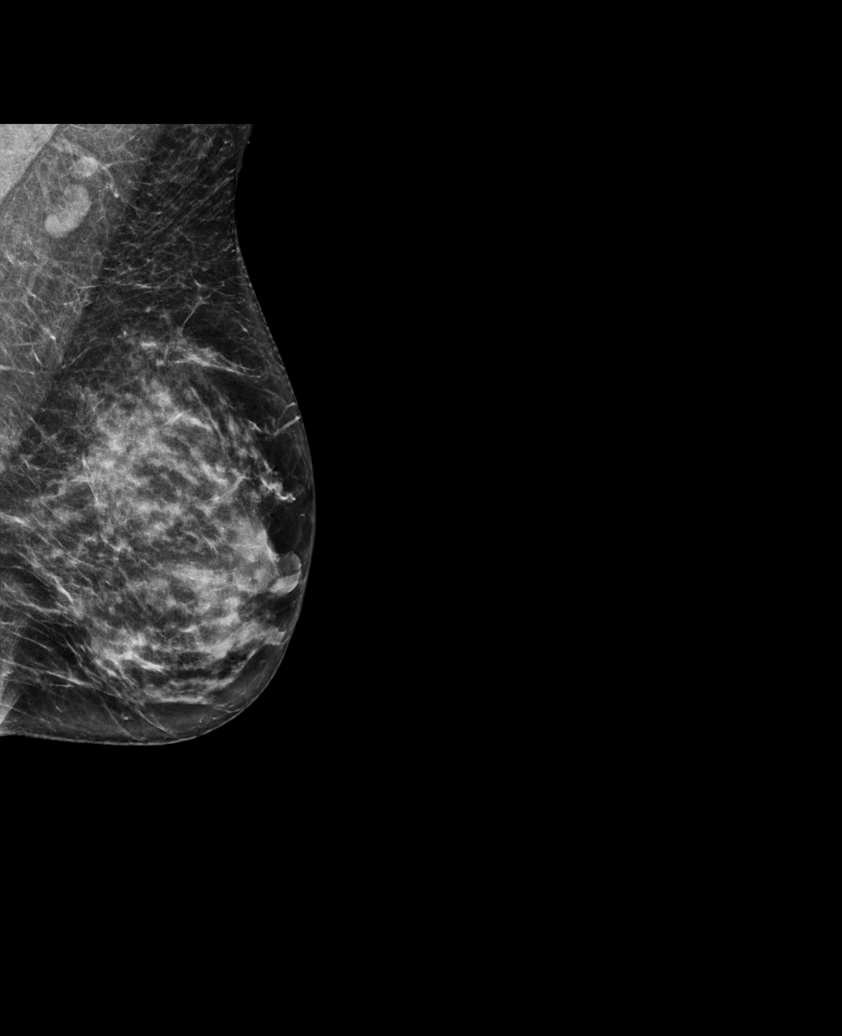

[L CC synth-2D (1 of 2)]
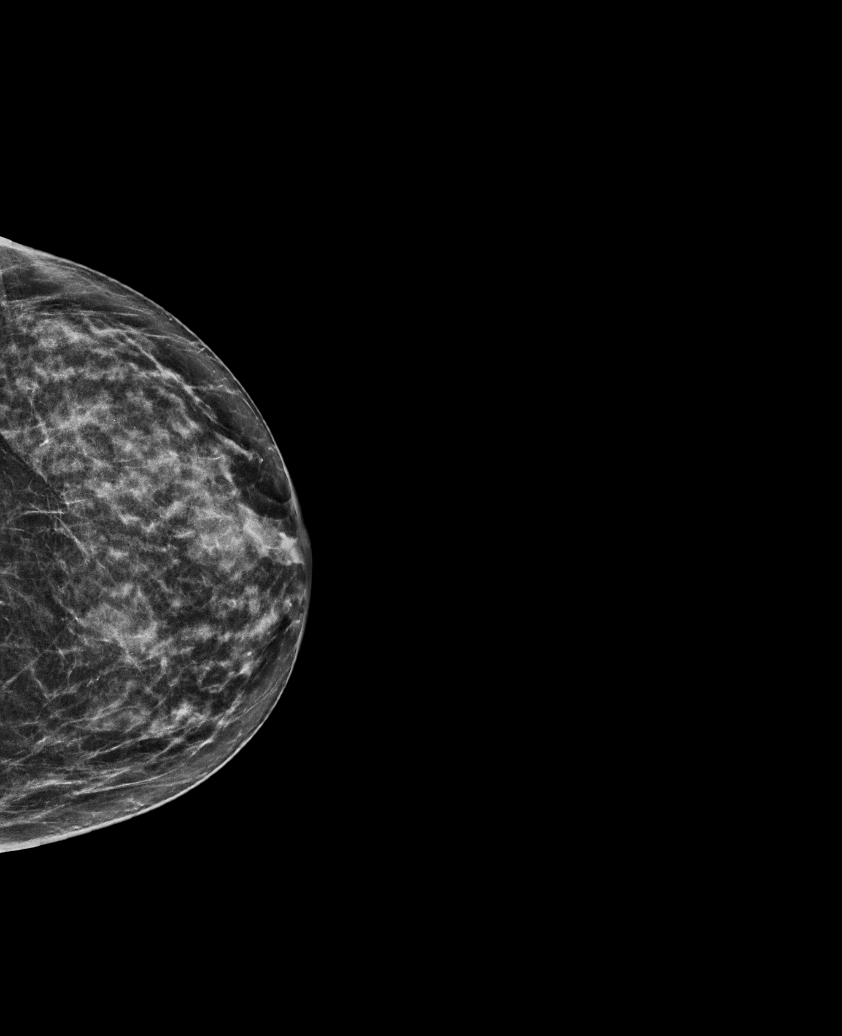

[R CC synth-2D]
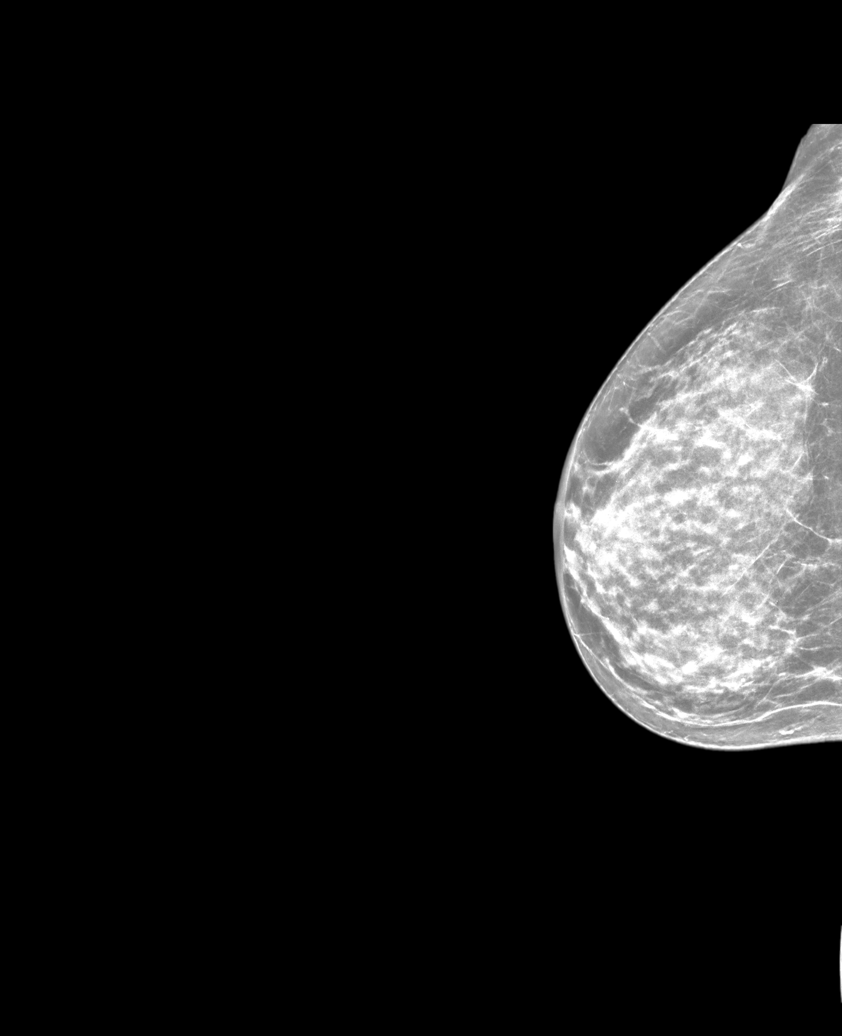

[L CC synth-2D (2 of 2)]
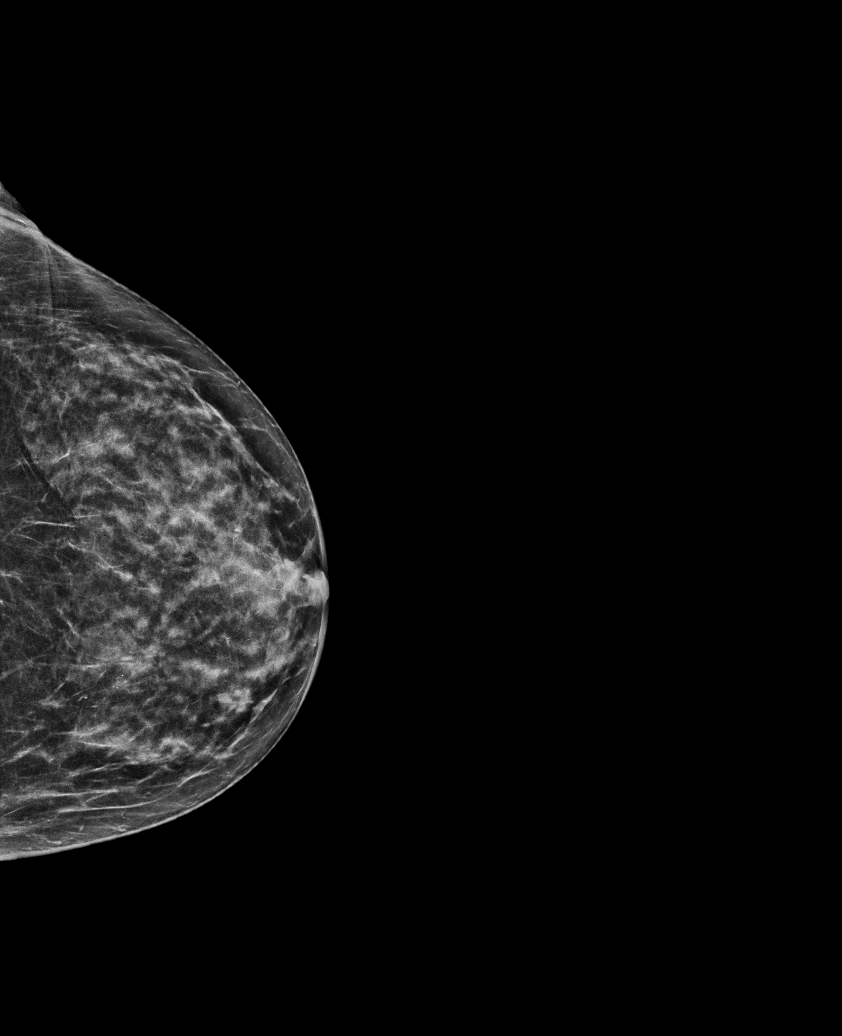

[R MLO tomo · tomo slice 31/60.0]
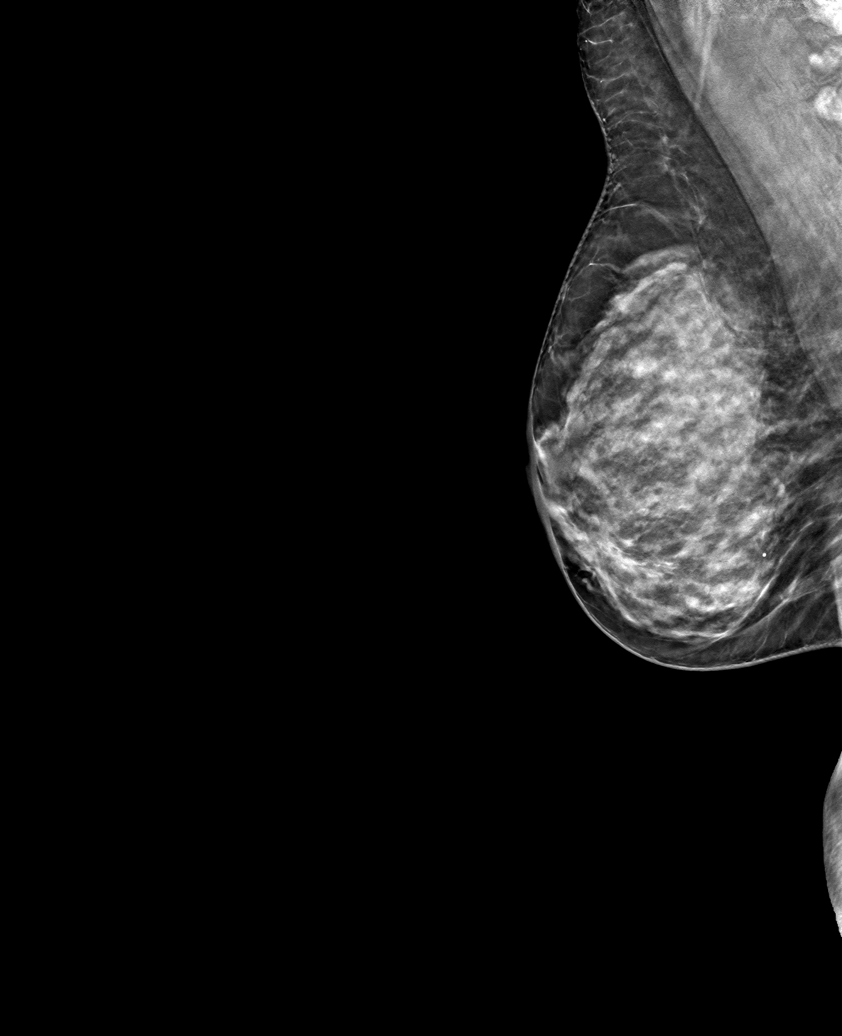

[6 of 30 positions shown; findings below may reference images not displayed]

ACR Breast Density Category d: The breast tissue is extremely dense,
which lowers the sensitivity of mammography
FINDINGS: There are no findings suspicious for malignancy.
IMPRESSION: No mammographic evidence of malignancy. A result letter of this
screening mammogram will be mailed directly to the patient.

RECOMMENDATION:
Screening mammogram in one year. (Code:[id number]-[id number])

BI-RADS CATEGORY  1: Negative.

## 2023-02-10 ENCOUNTER — Other Ambulatory Visit: Payer: Self-pay | Admitting: Family Medicine

## 2023-02-10 DIAGNOSIS — Z1231 Encounter for screening mammogram for malignant neoplasm of breast: Secondary | ICD-10-CM

## 2023-03-14 ENCOUNTER — Ambulatory Visit
Admission: RE | Admit: 2023-03-14 | Discharge: 2023-03-14 | Disposition: A | Discharge status: Home / Self Care | Payer: BC Managed Care – PPO | Source: Ambulatory Visit | Attending: Family Medicine | Admitting: Family Medicine

## 2023-03-14 DIAGNOSIS — Z1231 Encounter for screening mammogram for malignant neoplasm of breast: Secondary | ICD-10-CM
